# Patient Record
Sex: Male | Born: 1960 | Race: White | Hispanic: No | Smoking: Former smoker
Health system: Southern US, Community
[De-identification: ages and names within clinical notes are randomized; demographics above are authoritative.]

## PROBLEM LIST (undated history)

## (undated) DIAGNOSIS — F419 Anxiety disorder, unspecified: Secondary | ICD-10-CM

## (undated) DIAGNOSIS — I10 Essential (primary) hypertension: Secondary | ICD-10-CM

## (undated) DIAGNOSIS — E349 Endocrine disorder, unspecified: Secondary | ICD-10-CM

## (undated) DIAGNOSIS — E785 Hyperlipidemia, unspecified: Secondary | ICD-10-CM

## (undated) DIAGNOSIS — E039 Hypothyroidism, unspecified: Secondary | ICD-10-CM

## (undated) DIAGNOSIS — T7840XA Allergy, unspecified, initial encounter: Secondary | ICD-10-CM

## (undated) HISTORY — DX: Hyperlipidemia, unspecified: E78.5

## (undated) HISTORY — DX: Anxiety disorder, unspecified: F41.9

## (undated) HISTORY — DX: Endocrine disorder, unspecified: E34.9

## (undated) HISTORY — DX: Allergy, unspecified, initial encounter: T78.40XA

## (undated) HISTORY — DX: Hypothyroidism, unspecified: E03.9

## (undated) HISTORY — DX: Essential (primary) hypertension: I10

---

## 1997-11-25 ENCOUNTER — Observation Stay (HOSPITAL_COMMUNITY): Admission: AD | Admit: 1997-11-25 | Discharge: 1997-11-26 | Payer: Self-pay | Admitting: *Deleted

## 1997-12-02 ENCOUNTER — Ambulatory Visit (HOSPITAL_COMMUNITY): Admission: RE | Admit: 1997-12-02 | Discharge: 1997-12-02 | Payer: Self-pay | Admitting: *Deleted

## 1999-09-30 ENCOUNTER — Ambulatory Visit (HOSPITAL_COMMUNITY): Admission: RE | Admit: 1999-09-30 | Discharge: 1999-09-30 | Payer: Self-pay | Admitting: Internal Medicine

## 1999-09-30 ENCOUNTER — Encounter: Payer: Self-pay | Admitting: Internal Medicine

## 2003-06-26 ENCOUNTER — Ambulatory Visit (HOSPITAL_COMMUNITY): Admission: RE | Admit: 2003-06-26 | Discharge: 2003-06-26 | Payer: Self-pay | Admitting: Internal Medicine

## 2003-06-26 IMAGING — CR DG CHEST 2V
2 series · 2 of 2 positions shown · non-contrast
Comparison: none

CLINICAL DATA: Hypertension.
 TWO VIEW CHEST:
 Comparison [DATE].
 Two view exam of the chest shows no focal consolidation, edema or effusion.  Cardio/pericardial silhouette is within normal limits for size.  The bones are unremarkable.  
 IMPRESSION
 Stable exam without acute cardiopulmonary process.  Left nipple jewelry has been removed in the interval.

[view not recorded (1 of 2)]
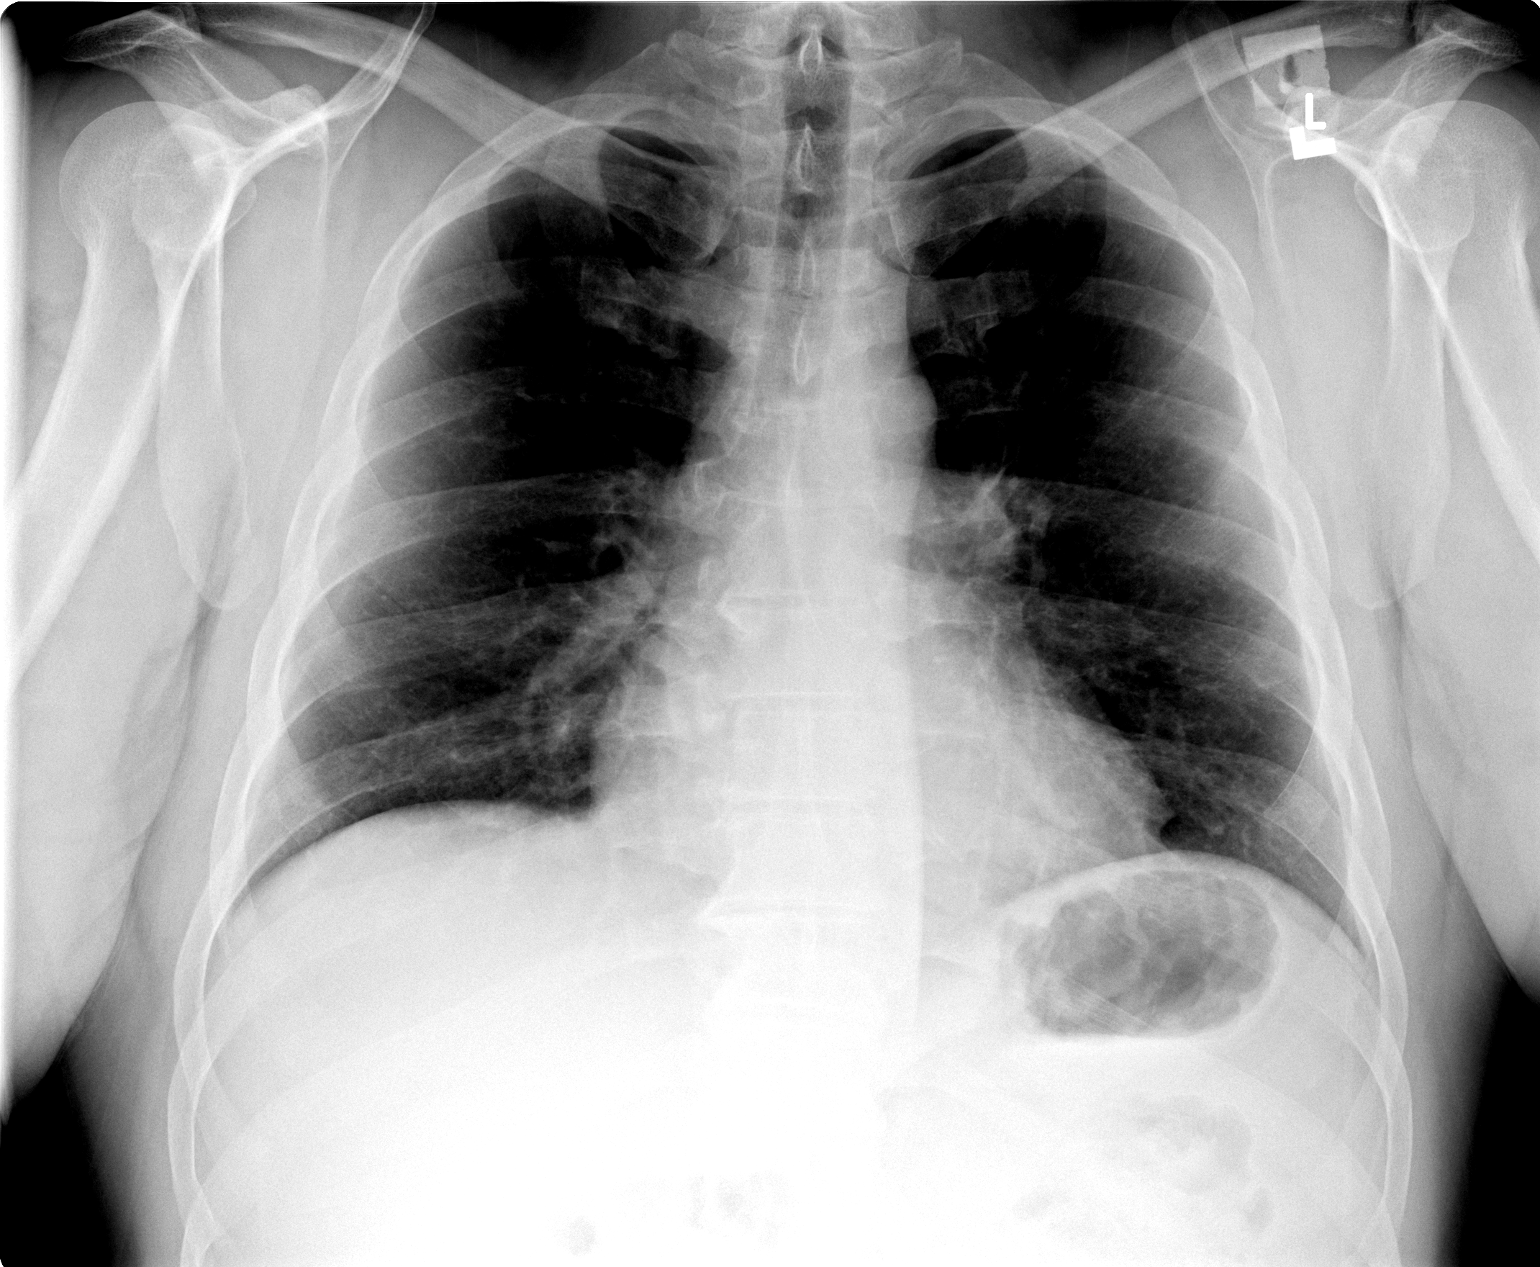

[view not recorded (2 of 2)]
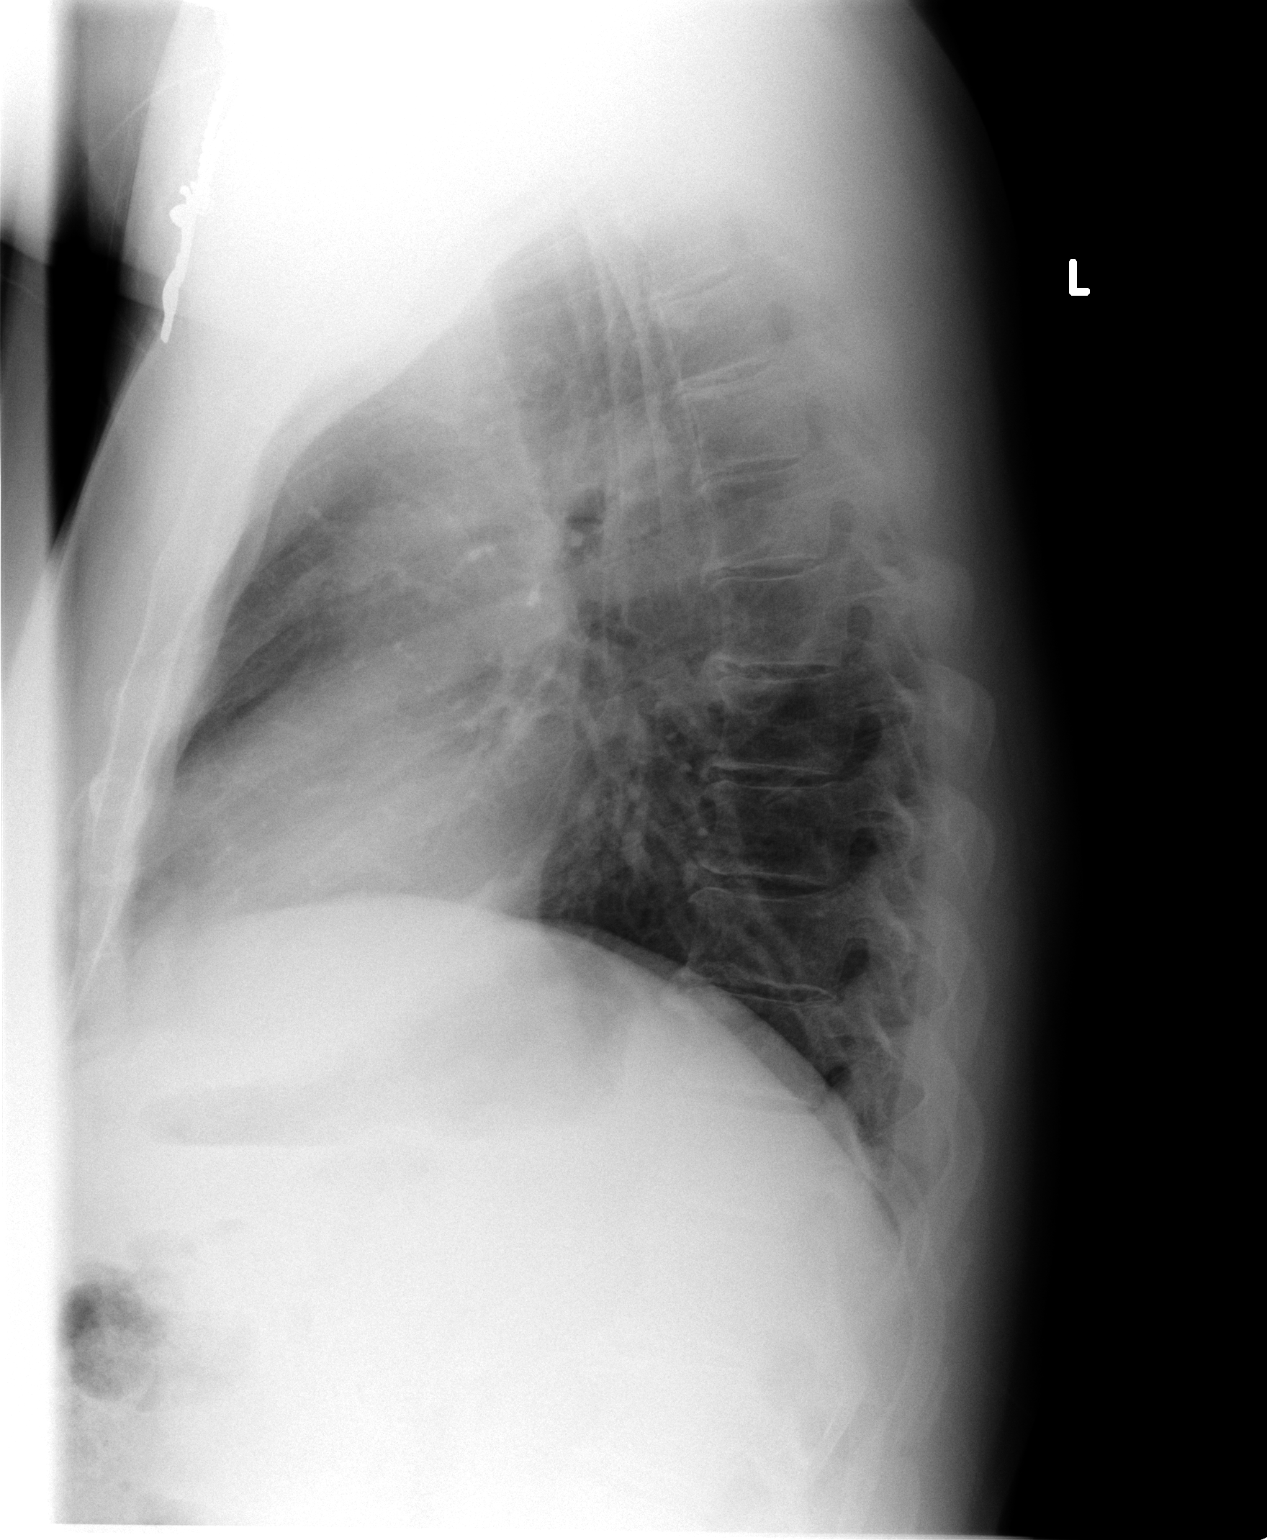

[2 of 2 positions shown; findings below may reference images not displayed]

## 2004-01-31 HISTORY — PX: COLON SURGERY: SHX602

## 2004-01-31 HISTORY — PX: APPENDECTOMY: SHX54

## 2005-08-16 ENCOUNTER — Ambulatory Visit (HOSPITAL_COMMUNITY): Admission: RE | Admit: 2005-08-16 | Discharge: 2005-08-16 | Payer: Self-pay | Admitting: Internal Medicine

## 2005-08-16 IMAGING — CR DG FOOT COMPLETE 3+V*R*
3 series · 3 of 3 positions shown · non-contrast
Comparison: None.

RIGHT FOOT - 3  VIEW:

CLINICAL DATA: Injury. Pain and swelling.

[view not recorded (1 of 3)]
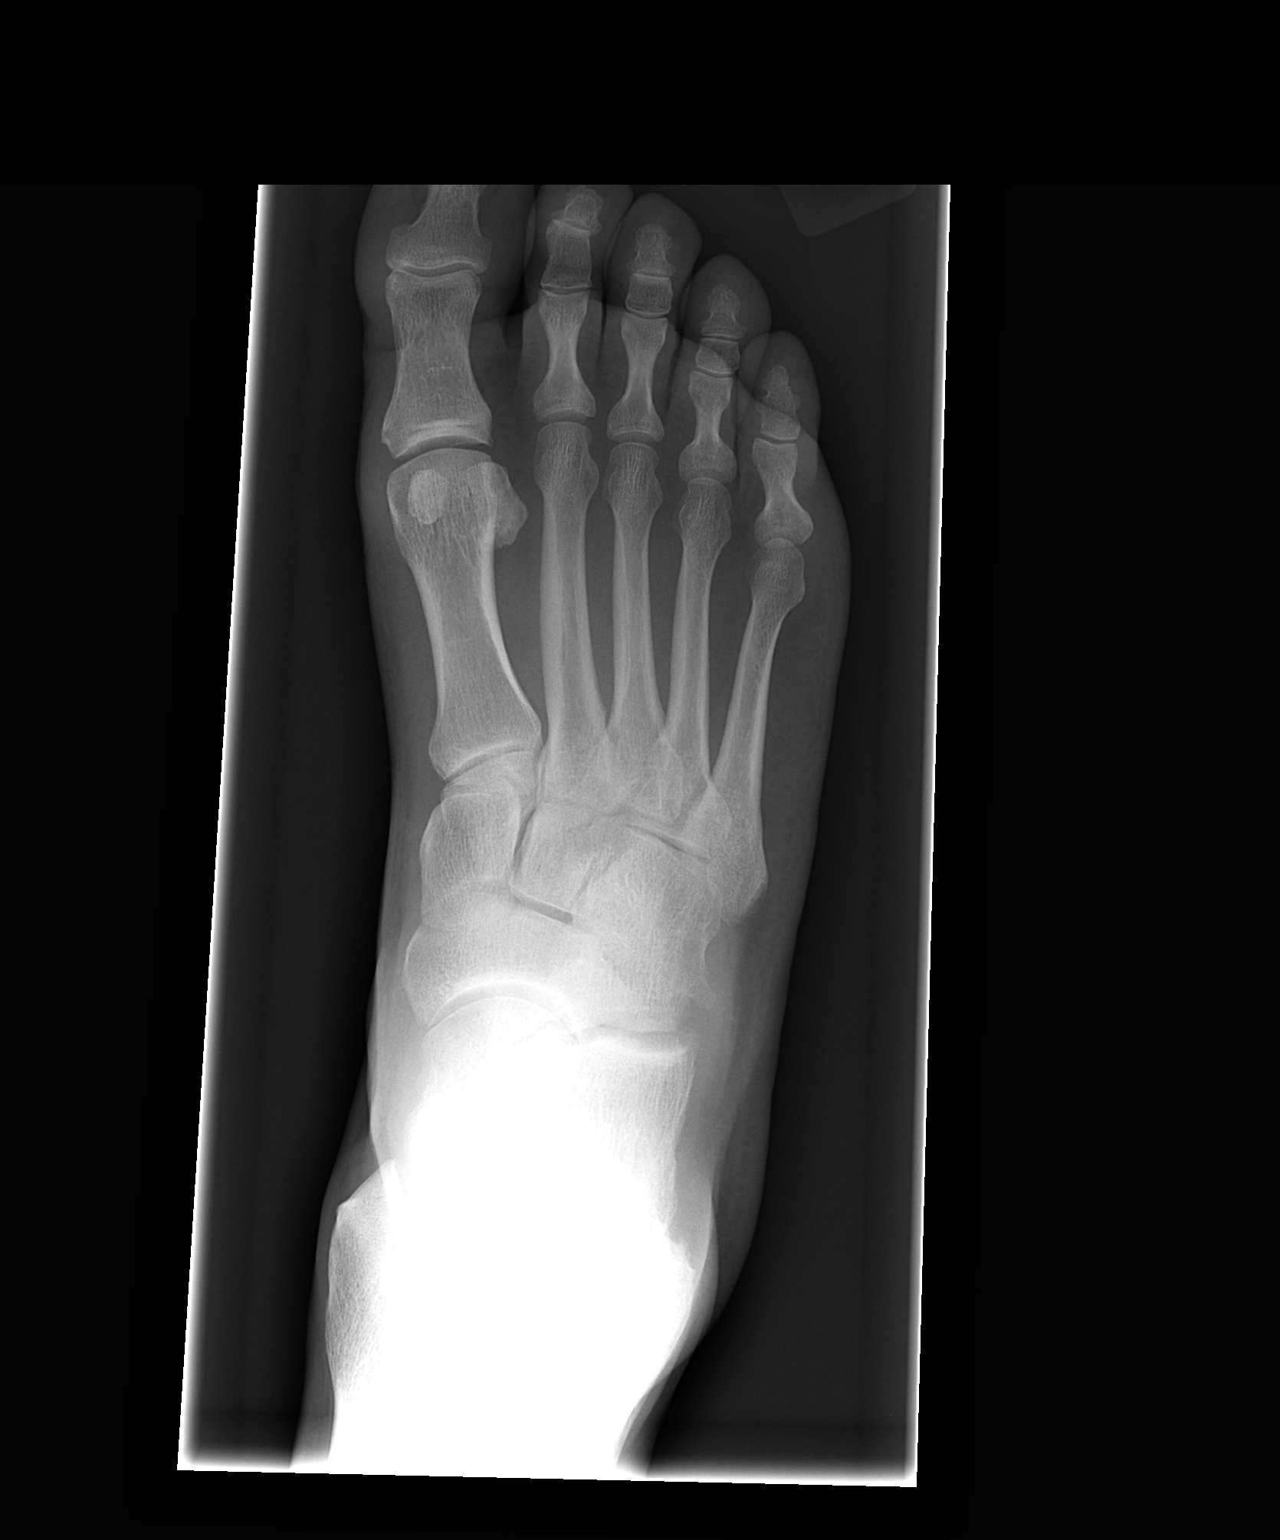

[view not recorded (2 of 3)]
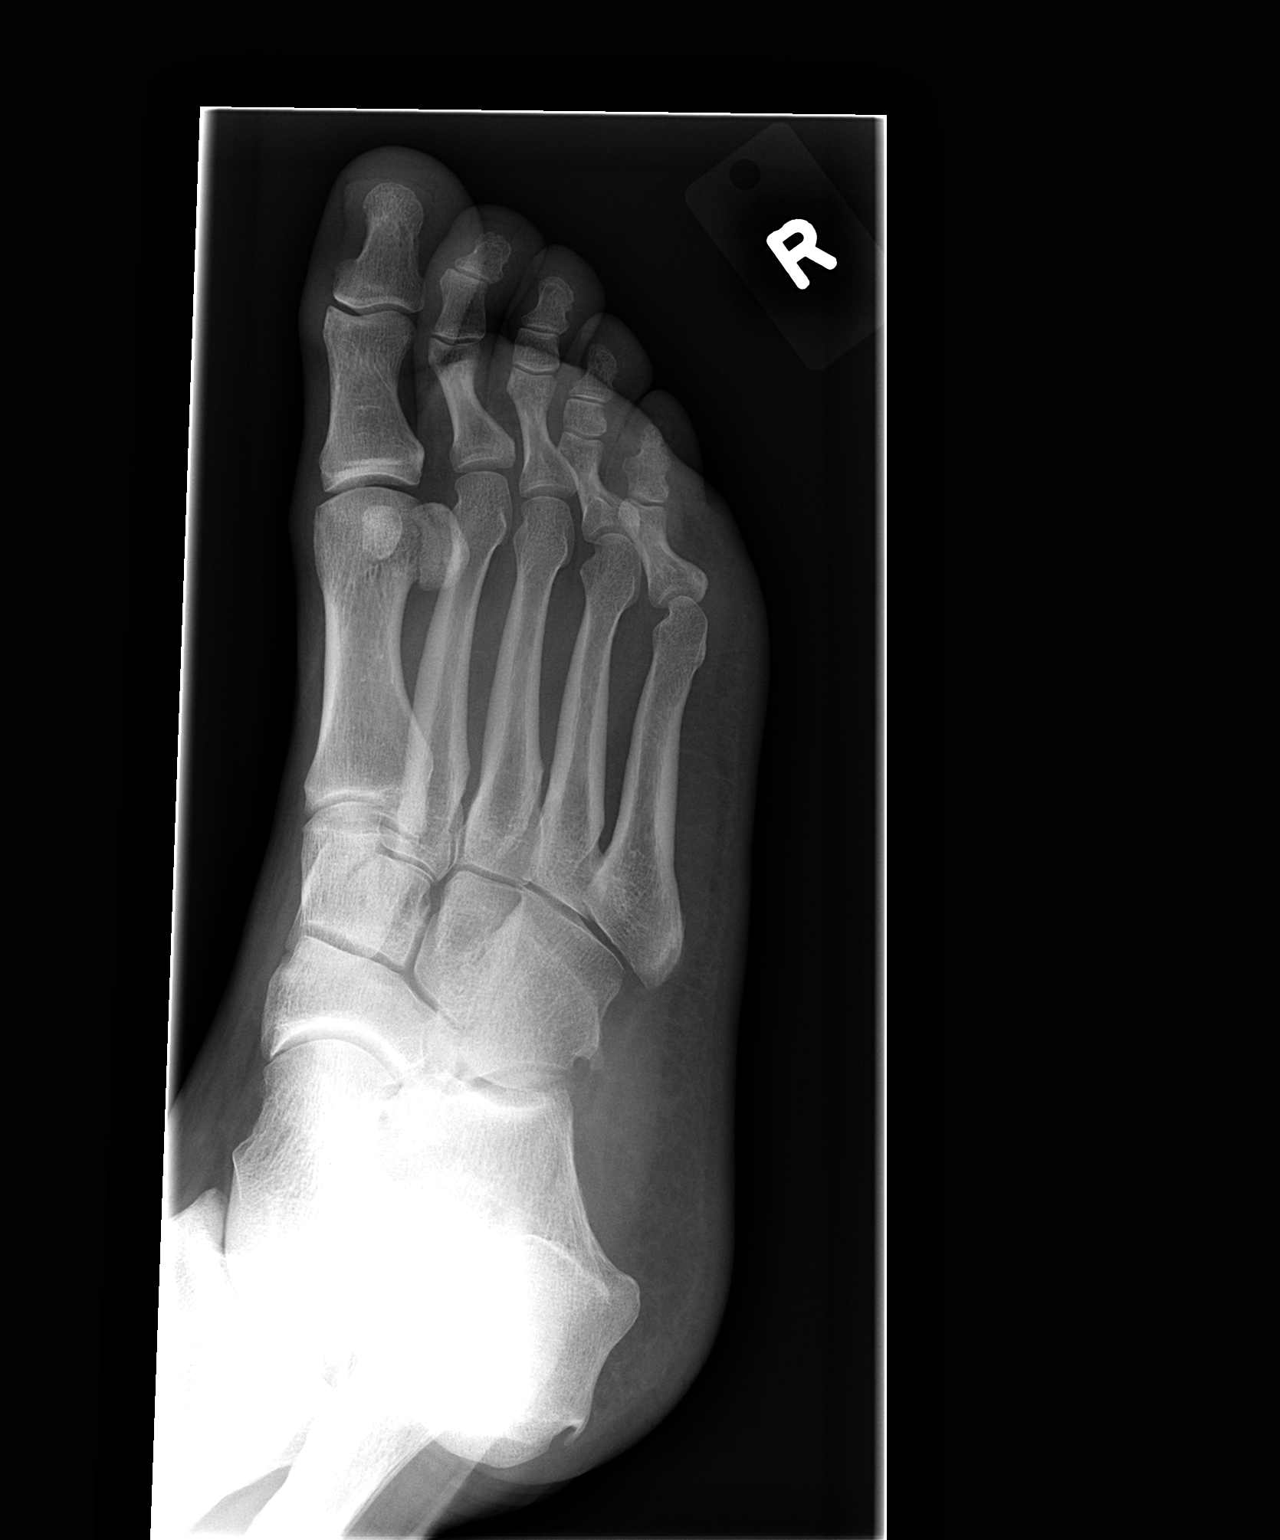

[view not recorded (3 of 3)]
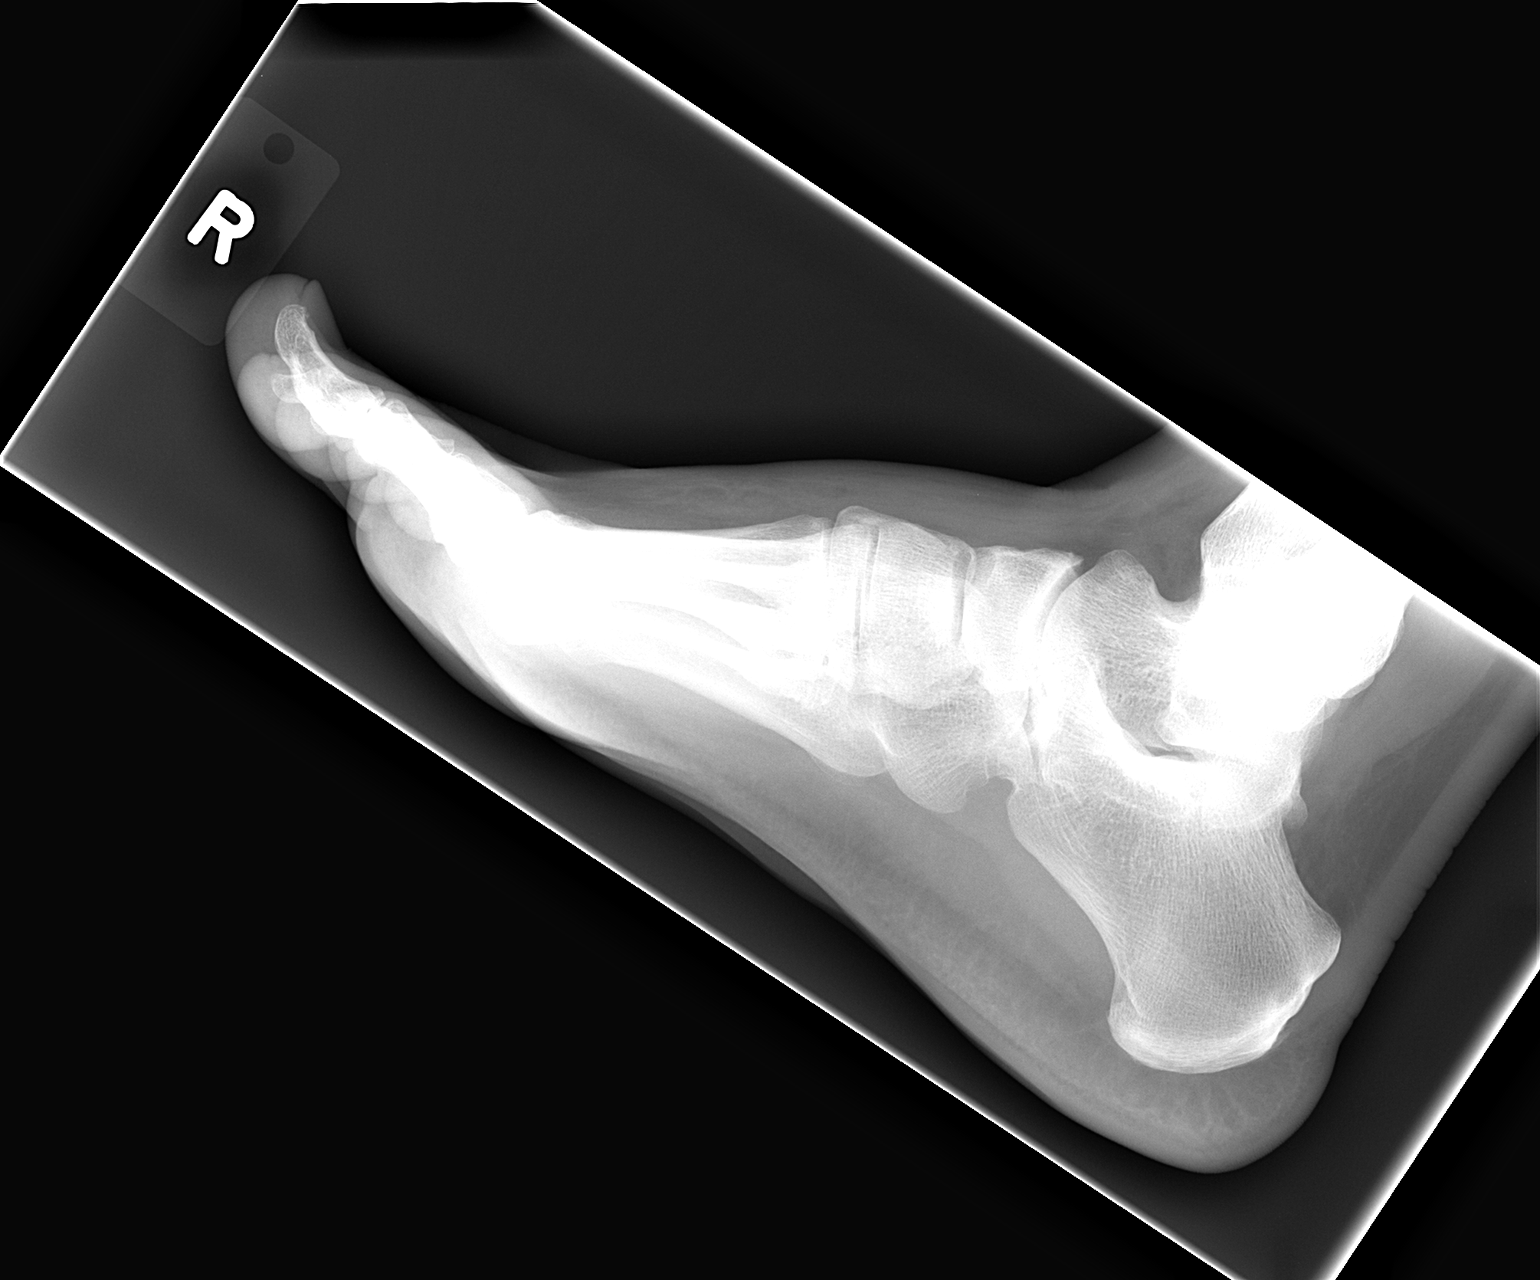

[3 of 3 positions shown; findings below may reference images not displayed]

FINDINGS: No evidence for acute fracture or dislocation.  Bony alignment is
intact.  Overlying soft tissues are unremarkable.
IMPRESSION: No acute bony abnormality.

## 2010-09-09 ENCOUNTER — Ambulatory Visit (AMBULATORY_SURGERY_CENTER): Payer: BC Managed Care – PPO | Admitting: *Deleted

## 2010-09-09 VITALS — Ht 68.0 in | Wt 198.2 lb

## 2010-09-09 DIAGNOSIS — Z1211 Encounter for screening for malignant neoplasm of colon: Secondary | ICD-10-CM

## 2010-09-09 MED ORDER — PEG-KCL-NACL-NASULF-NA ASC-C 100 G PO SOLR: ORAL | Status: DC

## 2010-09-23 ENCOUNTER — Encounter: Payer: Self-pay | Admitting: Internal Medicine

## 2010-09-23 ENCOUNTER — Ambulatory Visit (AMBULATORY_SURGERY_CENTER): Payer: BC Managed Care – PPO | Admitting: Internal Medicine

## 2010-09-23 VITALS — BP 132/87 | HR 79 | Temp 97.7°F | Resp 16

## 2010-09-23 DIAGNOSIS — E349 Endocrine disorder, unspecified: Secondary | ICD-10-CM | POA: Insufficient documentation

## 2010-09-23 DIAGNOSIS — D128 Benign neoplasm of rectum: Secondary | ICD-10-CM

## 2010-09-23 DIAGNOSIS — F419 Anxiety disorder, unspecified: Secondary | ICD-10-CM | POA: Insufficient documentation

## 2010-09-23 DIAGNOSIS — Z1211 Encounter for screening for malignant neoplasm of colon: Secondary | ICD-10-CM

## 2010-09-23 DIAGNOSIS — D129 Benign neoplasm of anus and anal canal: Secondary | ICD-10-CM

## 2010-09-23 DIAGNOSIS — K5730 Diverticulosis of large intestine without perforation or abscess without bleeding: Secondary | ICD-10-CM

## 2010-09-23 DIAGNOSIS — E119 Type 2 diabetes mellitus without complications: Secondary | ICD-10-CM

## 2010-09-23 HISTORY — PX: COLONOSCOPY: SHX174

## 2010-09-23 LAB — GLUCOSE, CAPILLARY: Glucose-Capillary: 102 mg/dL — ABNORMAL HIGH (ref 70–99)

## 2010-09-23 MED ORDER — SODIUM CHLORIDE 0.9 % IV SOLN: 500.0000 mL | INTRAVENOUS | Status: DC

## 2010-09-23 NOTE — Patient Instructions (Addendum)
A very small polyp that appears to be benign  was removed from the rectum. You also have diverticulosis, a common condition that usually does not bother people. We will send you a letter about your results and that means regarding your next routine colonoscopy timing. Please read the patient information about polyps and diverticulosis. Iva Boop, MD, [facility]  Please refer to your blue and neon green sheets for instructions regarding diet and activity for the rest of today.  You may resume your medications as you wold normally take them.

## 2010-09-26 ENCOUNTER — Telehealth: Payer: Self-pay | Admitting: *Deleted

## 2010-09-26 NOTE — Telephone Encounter (Signed)
No answer or identifier on answering machine at home number. No message left for patient.

## 2010-09-28 ENCOUNTER — Encounter: Payer: Self-pay | Admitting: Internal Medicine

## 2010-09-28 NOTE — Progress Notes (Signed)
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

## 2012-02-17 ENCOUNTER — Emergency Department (INDEPENDENT_AMBULATORY_CARE_PROVIDER_SITE_OTHER)
Admission: EM | Admit: 2012-02-17 | Discharge: 2012-02-17 | Disposition: A | Discharge status: Home / Self Care | Payer: BC Managed Care – PPO | Source: Home / Self Care | Attending: Family Medicine | Admitting: Family Medicine

## 2012-02-17 ENCOUNTER — Emergency Department (INDEPENDENT_AMBULATORY_CARE_PROVIDER_SITE_OTHER): Payer: BC Managed Care – PPO

## 2012-02-17 ENCOUNTER — Encounter: Payer: Self-pay | Admitting: Emergency Medicine

## 2012-02-17 DIAGNOSIS — W19XXXA Unspecified fall, initial encounter: Secondary | ICD-10-CM

## 2012-02-17 DIAGNOSIS — S6990XA Unspecified injury of unspecified wrist, hand and finger(s), initial encounter: Secondary | ICD-10-CM

## 2012-02-17 DIAGNOSIS — S5001XA Contusion of right elbow, initial encounter: Secondary | ICD-10-CM

## 2012-02-17 DIAGNOSIS — S60211A Contusion of right wrist, initial encounter: Secondary | ICD-10-CM

## 2012-02-17 DIAGNOSIS — S60219A Contusion of unspecified wrist, initial encounter: Secondary | ICD-10-CM

## 2012-02-17 DIAGNOSIS — S5000XA Contusion of unspecified elbow, initial encounter: Secondary | ICD-10-CM

## 2012-02-17 DIAGNOSIS — M25529 Pain in unspecified elbow: Secondary | ICD-10-CM

## 2012-02-17 DIAGNOSIS — M25539 Pain in unspecified wrist: Secondary | ICD-10-CM

## 2012-02-17 IMAGING — CR DG ELBOW COMPLETE 3+V*R*
4 series · 4 of 4 positions shown · non-contrast
Comparison: None.

CLINICAL DATA: Fall 1 week ago

RIGHT ELBOW - COMPLETE 3+ VIEW

[view not recorded (1 of 4)]
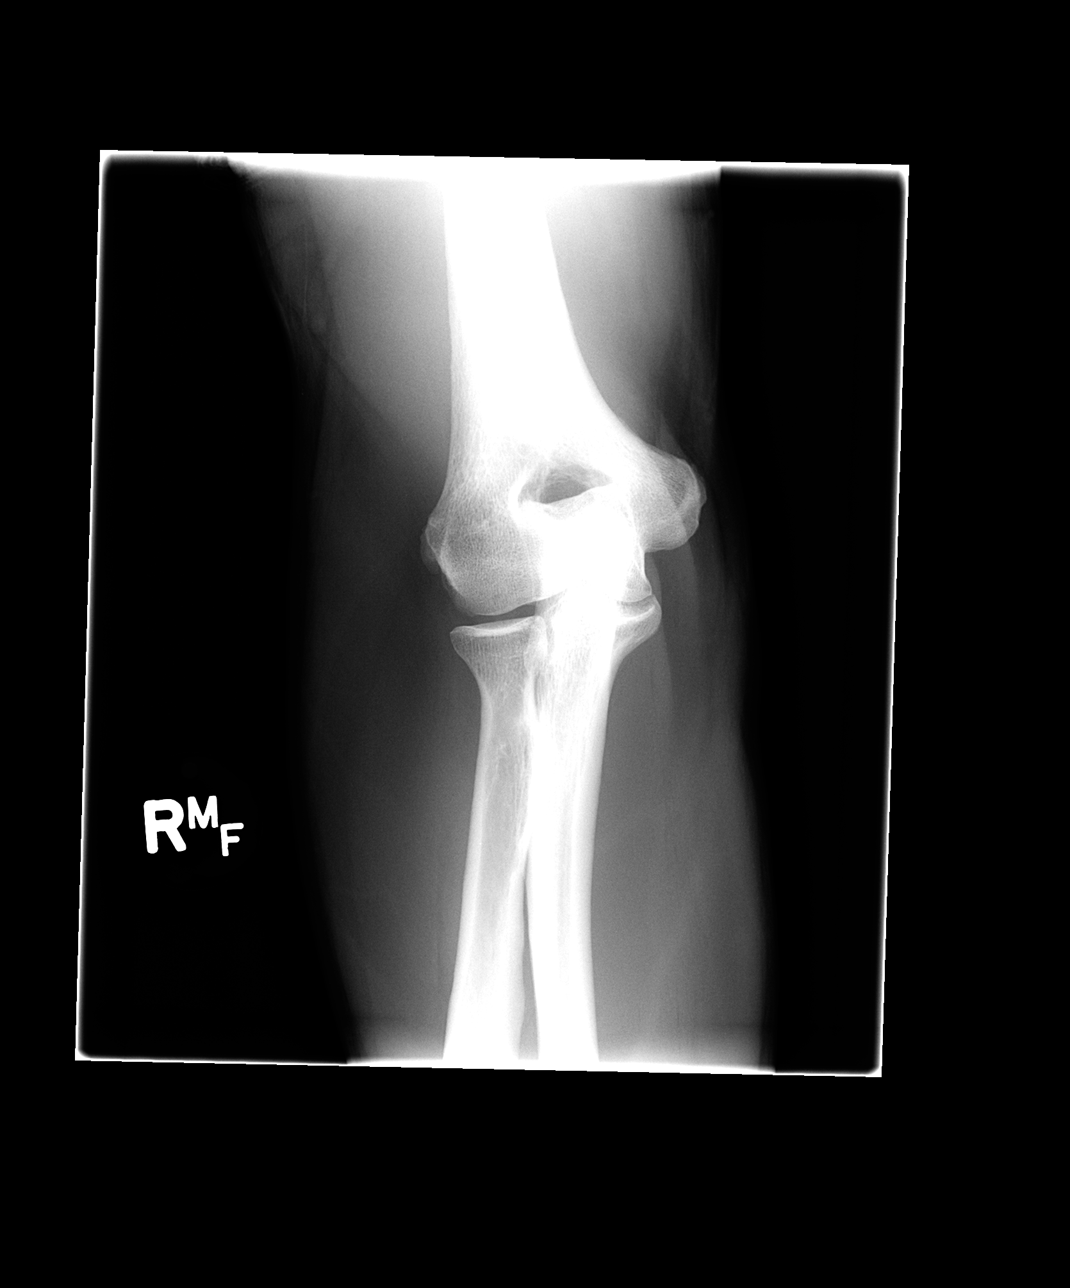

[view not recorded (2 of 4)]
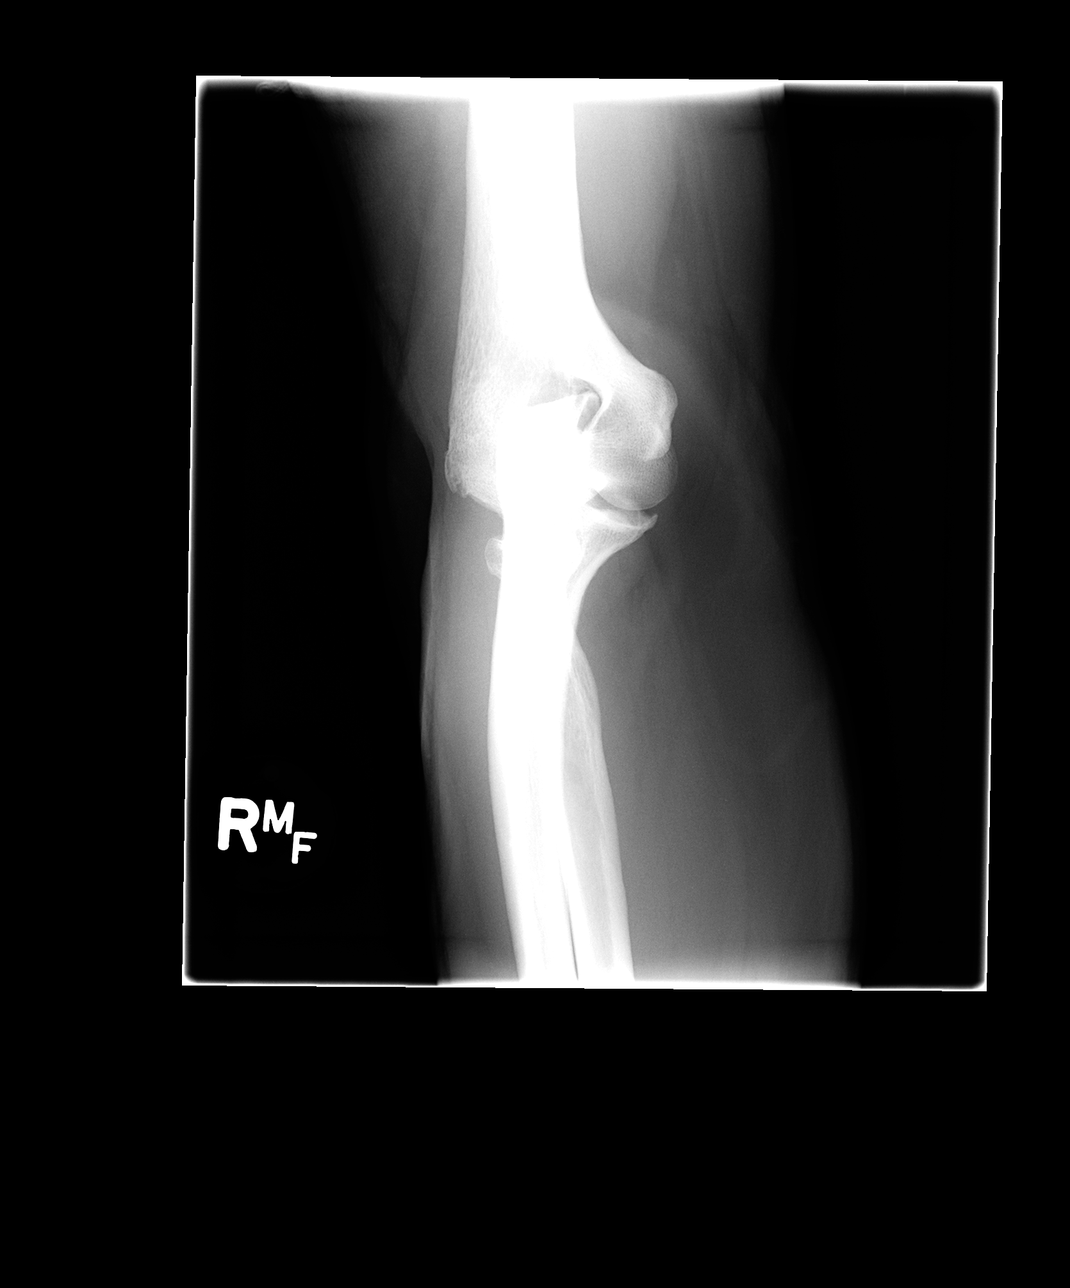

[view not recorded (3 of 4)]
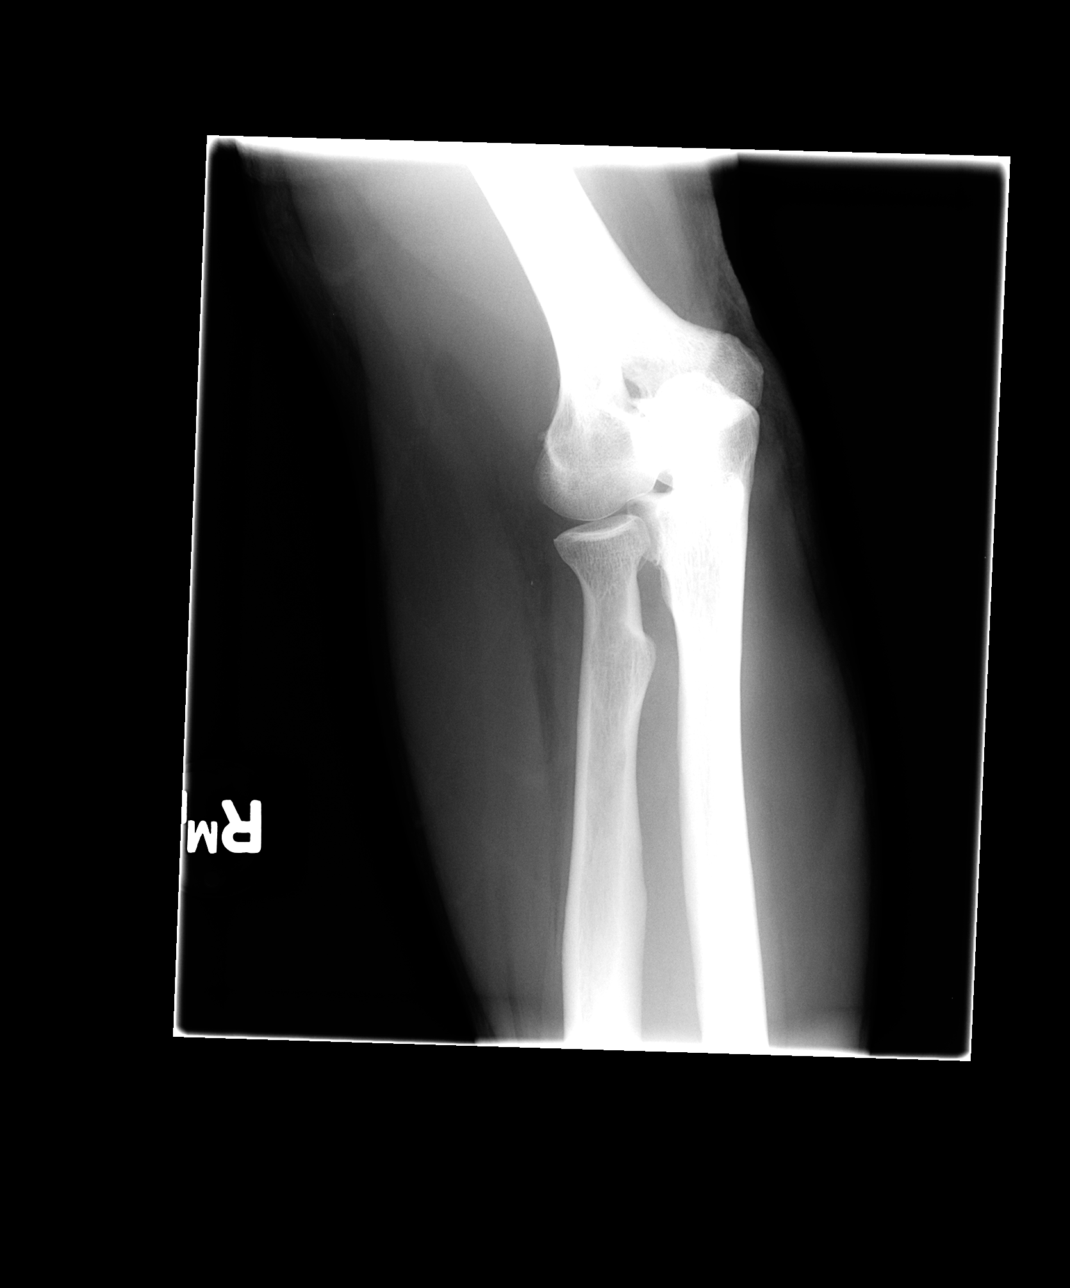

[view not recorded (4 of 4)]
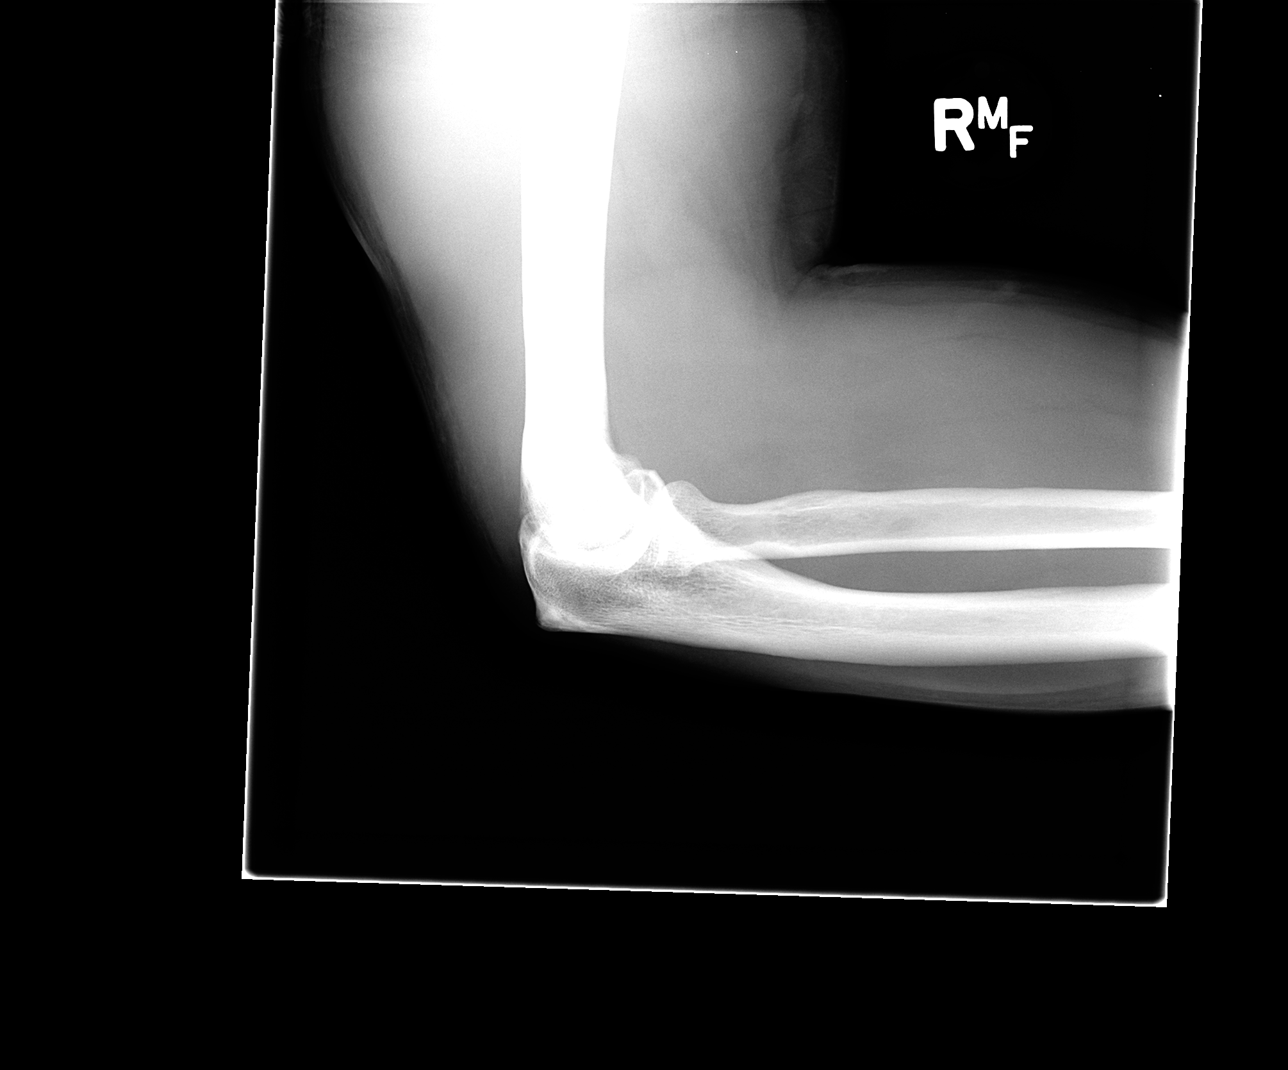

[4 of 4 positions shown; findings below may reference images not displayed]

FINDINGS: Negative for fracture.  Mild degenerative change with
spurring of the coronoid process.  Mild spurring of the medial
epicondyle.
IMPRESSION: Negative for fracture.

## 2012-02-17 IMAGING — CR DG WRIST COMPLETE 3+V*R*
2 series · 2 of 2 positions shown · non-contrast
Comparison: None

CLINICAL DATA: Fall 1 week ago.

RIGHT WRIST - COMPLETE 3+ VIEW

[view not recorded (1 of 2)]
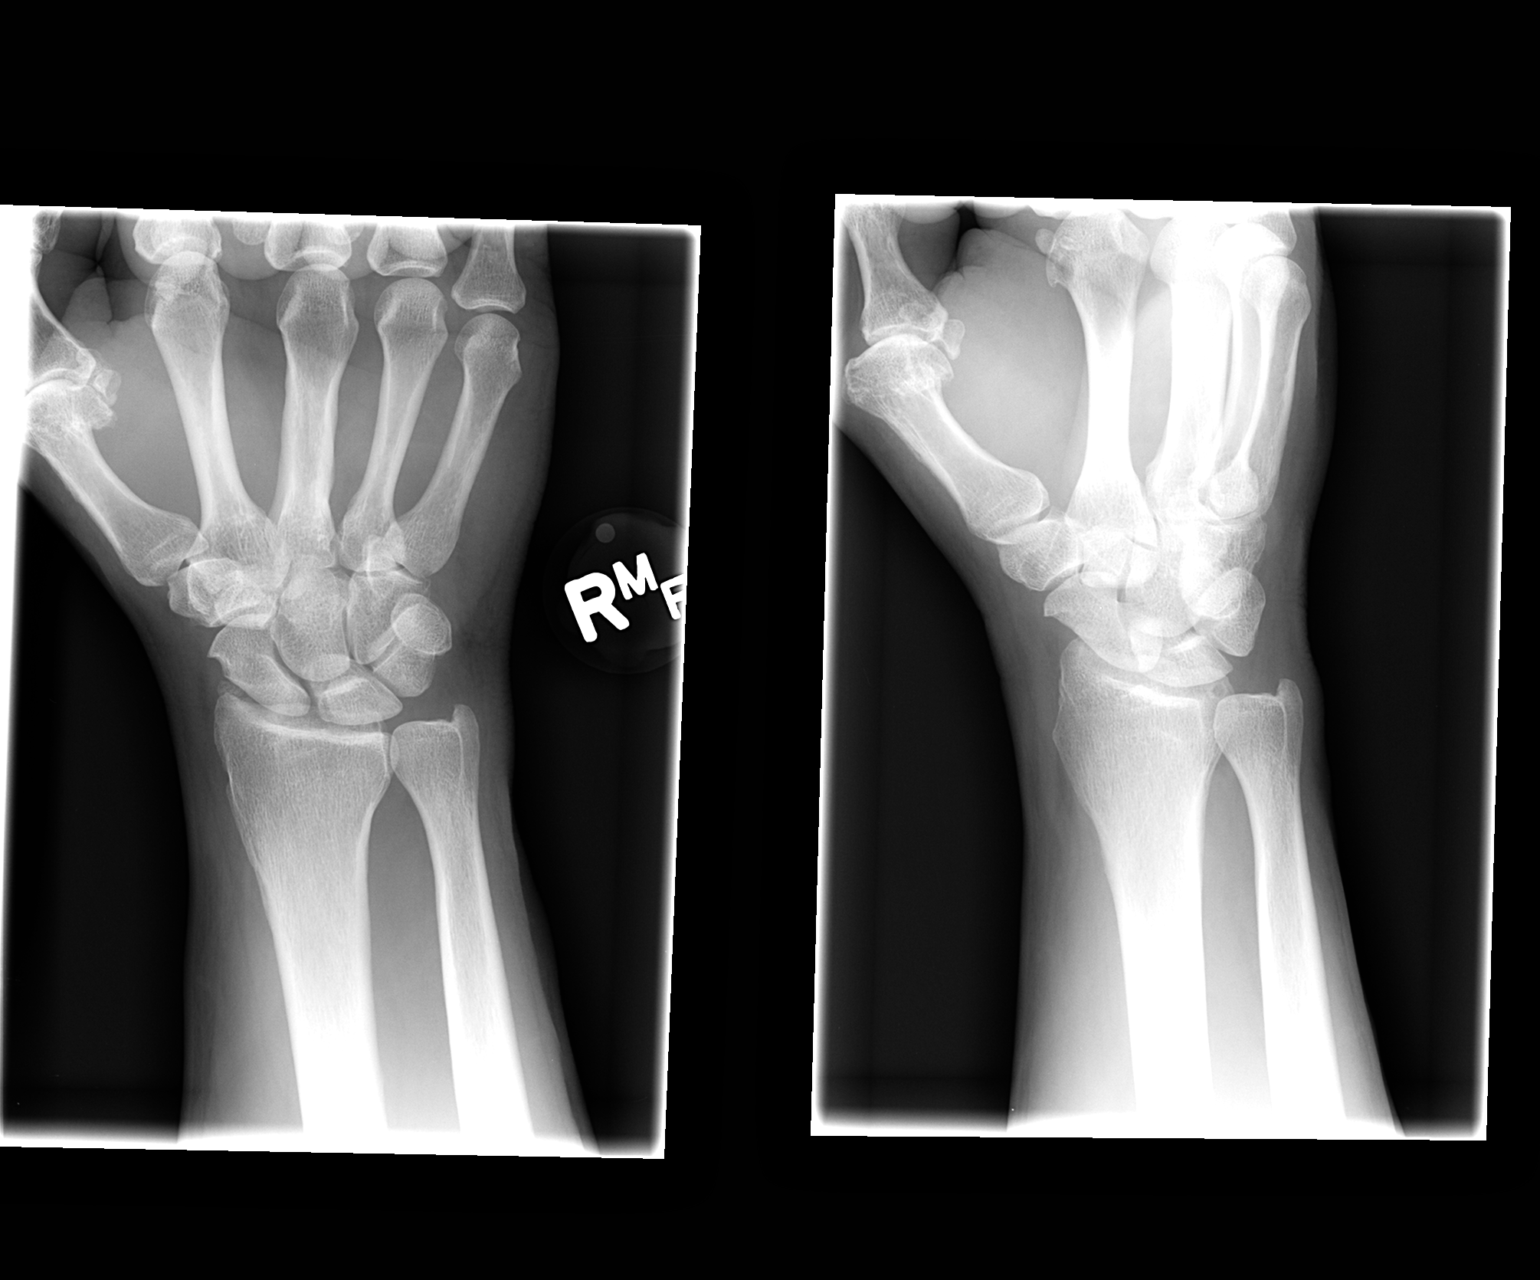

[view not recorded (2 of 2)]
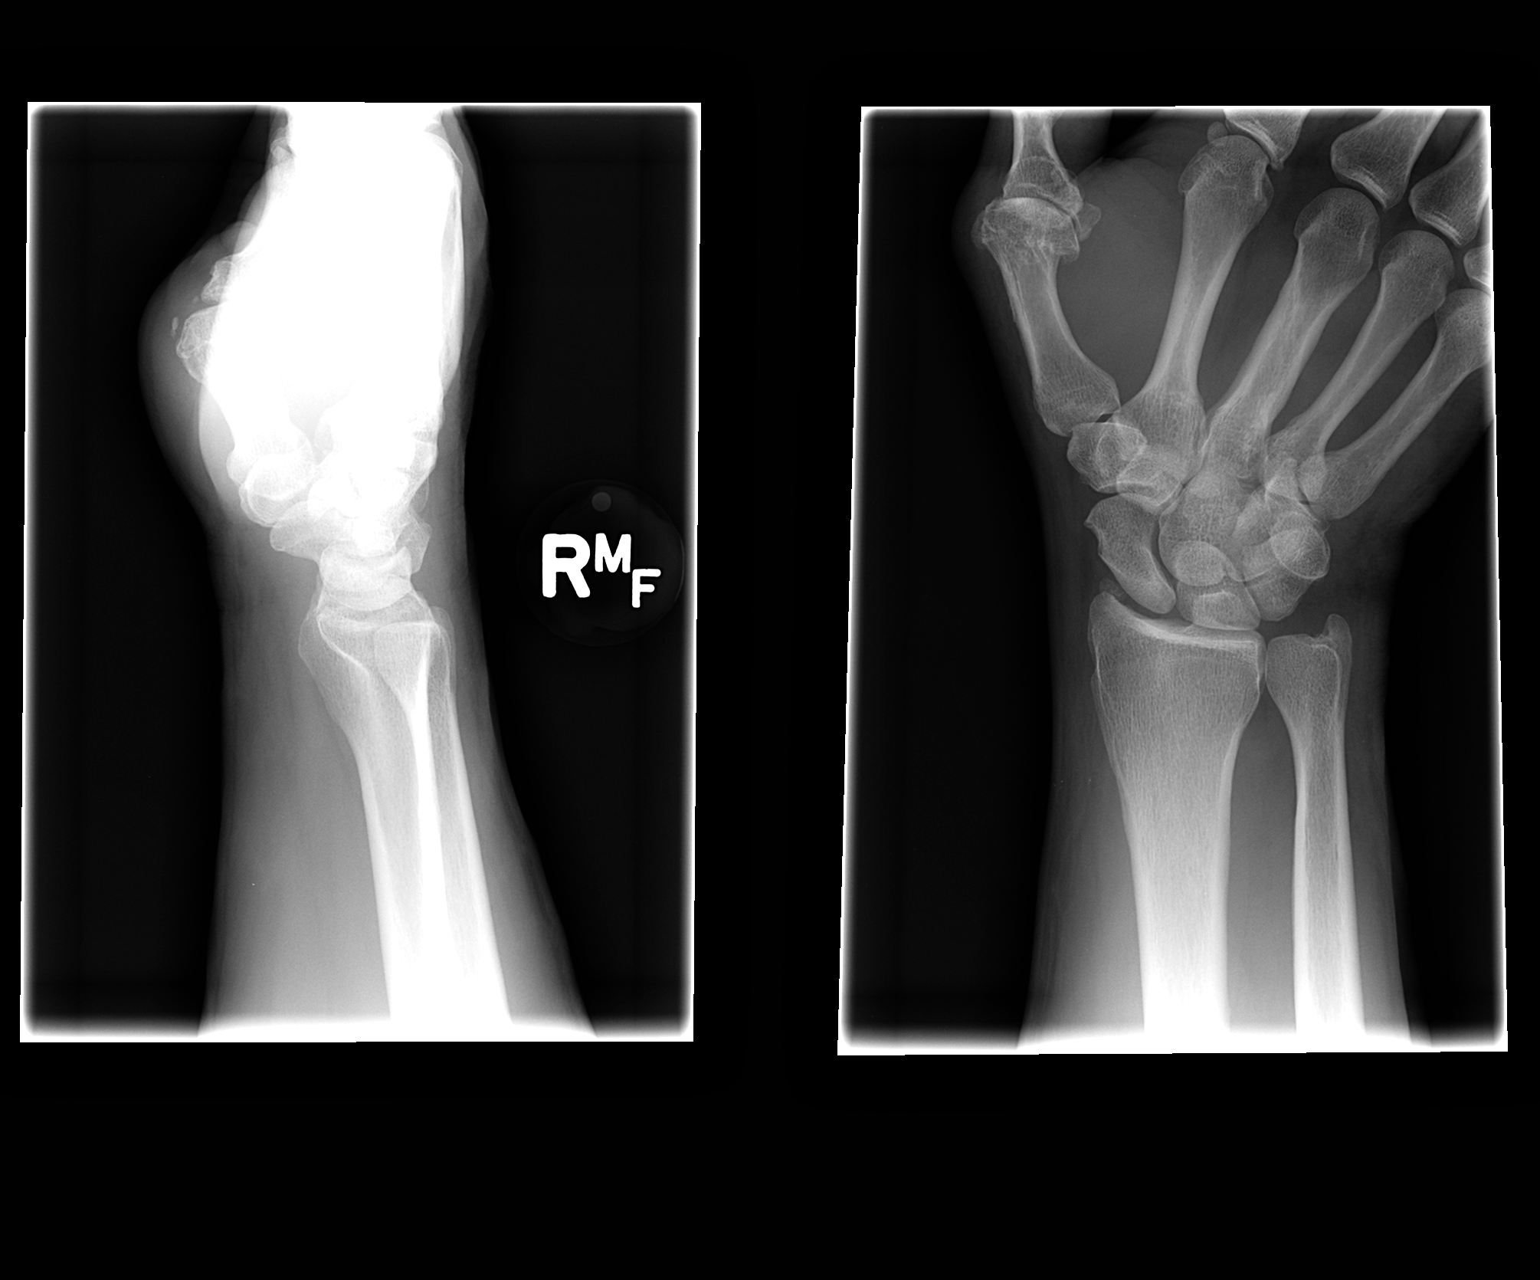

[2 of 2 positions shown; findings below may reference images not displayed]

FINDINGS: Negative for acute fracture.  Well corticated ossicle
adjacent to the   radial styloid appears chronic.  Radiocarpal
joint is intact.
IMPRESSION: No acute fracture.

## 2012-02-17 NOTE — ED Notes (Signed)
Reports falling on right side 7 days ago with assorted aches following; now persistent and intense pain in right elbow, and milder pain in right wrist.

## 2012-02-17 NOTE — ED Provider Notes (Signed)
History     CSN: [id number]  Arrival date & time 02/17/12  1449   First MD Initiated Contact with Patient 02/17/12 1516      Chief Complaint  Patient presents with  . Elbow Pain      HPI Comments: Patient fell on icy deck one week ago, injuring his right elbow and wrist.  He has persistent tenderness and pain in his right elbow and right wrist.  Patient is a 52 y.o. male presenting with arm injury. The history is provided by the patient.  Arm Injury  Episode onset: 1 week ago. The incident occurred at home. The injury mechanism was a fall. There is an injury to the right elbow and right wrist. The pain is moderate. It is unlikely that a foreign body is present. Pertinent negatives include no numbness, no focal weakness and no weakness. He is right-handed.    Past Medical History  Diagnosis Date  . Allergy   . Diabetes mellitus   . Anxiety   . Testosterone deficiency     Past Surgical History  Procedure Date  . Colon surgery 2006    large diverticulum resected  . Appendectomy 2006  . Colonoscopy 09/23/2010    diverticulosis, lymphoid rectal polyp    History reviewed. No pertinent family history.  History  Substance Use Topics  . Smoking status: Former Smoker    Quit date: 01/31/1995  . Smokeless tobacco: Never Used  . Alcohol Use: Yes     Comment: OCC WINE OR BEER,MAYBE MONTHLY      Review of Systems  Neurological: Negative for focal weakness, weakness and numbness.  All other systems reviewed and are negative.    Allergies  Food  Home Medications   Current Outpatient Rx  Name  Route  Sig  Dispense  Refill  . B COMPLEX PO TABS   Oral   Take 1 tablet by mouth daily.           Marland Kitchen VITAMIN D 2000 UNITS PO TABS   Oral   Take 2,000 Units by mouth daily.           Marland Kitchen CLONAZEPAM 2 MG PO TABS   Oral   Take 1 tablet by mouth Three times a day. 1/2-1 tab         . VITAMIN B 12 PO   Oral   Take 1,000 mcg by mouth daily.           . FENOFIBRATE  MICRONIZED 134 MG PO CAPS   Oral   Take 1 tablet by mouth Daily.         . GLYBURIDE 5 MG PO TABS   Oral   Take 0.5 tablets by mouth Twice daily.         Marland Kitchen METFORMIN HCL ER 500 MG PO TB24   Oral   Take 2 tablets by mouth Twice daily.         Marland Kitchen ONE-DAILY MULTI VITAMINS PO TABS   Oral   Take 1 tablet by mouth daily.           Marland Kitchen FISH OIL PO   Oral   Take 1,400 mg by mouth daily.           . TESTOSTERONE CYPIONATE 200 MG/ML IM OIL               . TRAZODONE HCL 300 MG PO TABS   Oral   Take 2 tablets by mouth At bedtime.  BP 144/76  Pulse 78  Temp 97.9 F (36.6 C) (Oral)  Resp 16  Ht 5\' 8"  (1.727 m)  Wt 185 lb (83.915 kg)  BMI 28.13 kg/m2  SpO2 97%  Physical Exam  Nursing note and vitals reviewed. Constitutional: He is oriented to person, place, and time. He appears well-developed and well-nourished. No distress.  HENT:  Head: Normocephalic and atraumatic.  Eyes: Conjunctivae normal are normal. Pupils are equal, round, and reactive to light.  Neck: Normal range of motion. No thyromegaly present.  Musculoskeletal: Normal range of motion. He exhibits tenderness. He exhibits no edema.       Right elbow: He exhibits normal range of motion, no swelling, no effusion, no deformity and no laceration. tenderness found. Medial epicondyle tenderness noted. No radial head, no lateral epicondyle and no olecranon process tenderness noted.       Right wrist: He exhibits tenderness and bony tenderness. He exhibits normal range of motion, no swelling, no effusion, no crepitus, no deformity and no laceration.       Right wrist reveals mild point tenderness over the ulnar styloid.  Good range of motion.  Wrist strength is intact in all planes.  Distal Neurovascular function is intact.            Neurological: He is alert and oriented to person, place, and time.  Skin: Skin is warm and dry. No rash noted.    ED Course  Procedures  none   Dg Elbow Complete  Right  02/17/2012  *RADIOLOGY REPORT*  Clinical Data: Fall 1 week ago  RIGHT ELBOW - COMPLETE 3+ VIEW  Comparison: None.  Findings: Negative for fracture.  Mild degenerative change with spurring of the coronoid process.  Mild spurring of the medial epicondyle.  IMPRESSION: Negative for fracture.   Original Report Authenticated By: Janeece Riggers, M.D.    Dg Wrist Complete Right  02/17/2012  *RADIOLOGY REPORT*  Clinical Data: Fall 1 week ago.  RIGHT WRIST - COMPLETE 3+ VIEW  Comparison: None  Findings: Negative for acute fracture.  Well corticated ossicle adjacent to the   radial styloid appears chronic.  Radiocarpal joint is intact.  IMPRESSION: No acute fracture.   Original Report Authenticated By: Janeece Riggers, M.D.      1. Contusion of right elbow   2. Contusion of right wrist; no evidence of TFCC injury on exam      MDM   Take Ibuprofen 200mg , 4 tabs every 8 hours with food.  Apply ice pack two or three times daily.  Avoid golf until resolved. Followup with orthopedist if not improving one week.  Lattie Haw, MD 02/17/12 [phone]

## 2012-12-15 ENCOUNTER — Other Ambulatory Visit: Payer: Self-pay | Admitting: Internal Medicine

## 2013-02-17 ENCOUNTER — Ambulatory Visit: Payer: Self-pay | Admitting: Physician Assistant

## 2013-03-20 ENCOUNTER — Encounter: Payer: Self-pay | Admitting: Internal Medicine

## 2013-04-13 ENCOUNTER — Other Ambulatory Visit: Payer: Self-pay | Admitting: Internal Medicine

## 2013-04-26 ENCOUNTER — Other Ambulatory Visit: Payer: Self-pay | Admitting: Internal Medicine

## 2013-05-07 ENCOUNTER — Other Ambulatory Visit: Payer: Self-pay | Admitting: Emergency Medicine

## 2013-05-07 ENCOUNTER — Telehealth: Payer: Self-pay | Admitting: *Deleted

## 2013-05-07 MED ORDER — CLONAZEPAM 2 MG PO TABS: 2.0000 mg | ORAL_TABLET | Freq: Three times a day (TID) | ORAL | Status: DC | PRN

## 2013-05-07 NOTE — Telephone Encounter (Signed)
CLONAZEPAM TAKE QID = NEEDS TO [facility]: [place]  PT IS OUT CAN WE PLEASE CALL IN

## 2013-05-17 DIAGNOSIS — I10 Essential (primary) hypertension: Secondary | ICD-10-CM | POA: Insufficient documentation

## 2013-05-17 DIAGNOSIS — E785 Hyperlipidemia, unspecified: Secondary | ICD-10-CM | POA: Insufficient documentation

## 2013-05-17 DIAGNOSIS — E119 Type 2 diabetes mellitus without complications: Secondary | ICD-10-CM

## 2013-05-17 DIAGNOSIS — E039 Hypothyroidism, unspecified: Secondary | ICD-10-CM | POA: Insufficient documentation

## 2013-05-18 DIAGNOSIS — E559 Vitamin D deficiency, unspecified: Secondary | ICD-10-CM | POA: Insufficient documentation

## 2013-05-18 DIAGNOSIS — Z79899 Other long term (current) drug therapy: Secondary | ICD-10-CM | POA: Insufficient documentation

## 2013-05-18 NOTE — Progress Notes (Signed)
Patient ID: Hunter Fields, male   DOB: [birth year], 53 y.o.   MRN: [id number]    This very nice 53 y.o. MWM presents for 3 month follow up with Hypertension, Hyperlipidemia, Pre-Diabetes, Testosterone and Vitamin D Deficiency. Patient also has essential tremor.   HTN predates since 55. BP has been controlled at home. Today's BP: 160/100 mmHg . Patient denies any cardiac type chest pain, palpitations, dyspnea/orthopnea/PND, dizziness, claudication, or dependent edema.   Hyperlipidemia is not controlled with diet & meds. Last Cholesterol was 178, Triglycerides were 514, HDL 31 and LDL not caklculated. Patient denies myalgias or other med SE's.    Also, the patient has history of T2 NIDDM in  2002 and A1c 6.6% in Oct 2014. Patient denies any  symptoms of reactive hypoglycemia, diabetic polys, paresthesias or visual blurring.   Further, Patient has history of Vitamin D Deficiency of 40 in 2008 min D of 40 in Oct 2014.ments vitamin D without any suspected side-effects.  Current Outpatient Prescriptions on File Prior to Visit  Medication Sig Dispense Refill  . b complex vitamins tablet Take 1 tablet by mouth daily.        . Cholecalciferol (VITAMIN D) 2000 UNITS tablet Take 2,000 Units by mouth daily.        . clonazePAM (KLONOPIN) 2 MG tablet Take 1 tablet (2 mg total) by mouth 3 (three) times daily as needed for anxiety. 1/2-1 tab  90 tablet  0  . Cyanocobalamin (VITAMIN B 12 PO) Take 1,000 mcg by mouth daily.        . fenofibrate micronized (LOFIBRA) 134 MG capsule TAKE 1 CAPSULE DAILY FOR BLOOD FATS  90 capsule  1  . metFORMIN (GLUCOPHAGE-XR) 500 MG 24 hr tablet TAKE 2 TABLETS TWICE A DAY FOR DIABETES MELLITUS  360 tablet  0  . Multiple Vitamin (MULTIVITAMIN) tablet Take 1 tablet by mouth daily.        . Omega-3 Fatty Acids (FISH OIL PO) Take 1,400 mg by mouth daily.        Marland Kitchen testosterone cypionate (DEPOTESTOTERONE CYPIONATE) 200 MG/ML injection inject 2cc instramuscularly every 2 weeks or as  directed by physician  10 mL  1   No current facility-administered medications on file prior to visit.     Allergies  Allergen Reactions  . Food Swelling    MUSHROOMS=SWELLING    PMHx:   Past Medical History  Diagnosis Date  . Allergy   . Diabetes mellitus   . Anxiety   . Testosterone deficiency   . Hyperlipidemia   . Hypertension   . Hypothyroid     FHx:    Reviewed / unchanged  SHx:    Reviewed / unchanged   Systems Review: Constitutional: Denies fever, chills, wt changes, headaches, insomnia, fatigue, night sweats, change in appetite. Eyes: Denies redness, blurred vision, diplopia, discharge, itchy, watery eyes.  ENT: Denies discharge, congestion, post nasal drip, epistaxis, sore throat, earache, hearing loss, dental pain, tinnitus, vertigo, sinus pain, snoring.  CV: Denies chest pain, palpitations, irregular heartbeat, syncope, dyspnea, diaphoresis, orthopnea, PND, claudication, edema. Respiratory: denies cough, dyspnea, DOE, pleurisy, hoarseness, laryngitis, wheezing.  Gastrointestinal: Denies dysphagia, odynophagia, heartburn, reflux, water brash, abdominal pain or cramps, nausea, vomiting, bloating, diarrhea, constipation, hematemesis, melena, hematochezia,  or hemorrhoids. Genitourinary: Denies dysuria, frequency, urgency, nocturia, hesitancy, discharge, hematuria, flank pain. Musculoskeletal: Denies arthralgias, myalgias, stiffness, jt. swelling, pain, limp, strain/sprain.  Skin: Denies pruritus, rash, hives, warts, acne, eczema, change in skin lesion(s). Neuro: No weakness, tremor, incoordination, spasms, paresthesia,  or pain. Psychiatric: Denies confusion, memory loss, or sensory loss. Endo: Denies change in weight, skin, hair change.  Heme/Lymph: No excessive bleeding, bruising, orenlarged lymph nodes.   Exam:  BP 160/100  Pulse 76  Temp(Src) 98.4 F (36.9 C) (Temporal)  Resp 16  Ht 5' 7.5" (1.715 m)  Wt 186 lb 9.6 oz (84.641 kg)  BMI 28.78  kg/m2  Appears well nourished - in no distress. Eyes: PERRLA, EOMs, conjunctiva no swelling or erythema. Sinuses: No frontal/maxillary tenderness ENT/Mouth: EAC's clear, TM's nl w/o erythema, bulging. Nares clear w/o erythema, swelling, exudates. Oropharynx clear without erythema or exudates. Oral hygiene is good. Tongue normal, non obstructing. Hearing intact.  Neck: Supple. Thyroid nl. Car 2+/2+ without bruits, nodes or JVD. Chest: Respirations nl with BS clear & equal w/o rales, rhonchi, wheezing or stridor.  Cor: Heart sounds normal w/ regular rate and rhythm without sig. murmurs, gallops, clicks, or rubs. Peripheral pulses normal and equal  without edema.  Abdomen: Soft & bowel sounds normal. Non-tender w/o guarding, rebound, hernias, masses, or organomegaly.  Lymphatics: Unremarkable.  Musculoskeletal: Full ROM all peripheral extremities, joint stability, 5/5 strength, and normal gait.  Skin: Warm, dry without exposed rashes, lesions, ecchymosis apparent.  Neuro: Cranial nerves intact, reflexes equal bilaterally. Sensory-motor testing grossly intact. Tendon reflexes grossly intact.  Pysch: Alert & oriented x 3. Insight and judgement nl & appropriate. No ideations.  Assessment and Plan:  1. Hypertension - Continue monitor blood pressure at home. Continue diet/meds same.  2. Hyperlipidemia - Continue diet/meds, exercise,& lifestyle modifications. Continue monitor periodic cholesterol/liver & renal functions    3. T2 NIDDM - continue recommend prudent low glycemic diet, weight control, regular exercise, diabetic monitoring and periodic eye exams.  4. Vitamin D Deficiency - Continue supplementation.  5. Testosterone Deficiency - continue supplementation  Recommended regular exercise, BP monitoring, weight control, and discussed med and SE's. Recommended labs to assess and monitor clinical status. Further disposition pending results of labs.

## 2013-05-18 NOTE — Patient Instructions (Signed)

## 2013-05-19 ENCOUNTER — Ambulatory Visit (INDEPENDENT_AMBULATORY_CARE_PROVIDER_SITE_OTHER): Payer: BC Managed Care – PPO | Admitting: Internal Medicine

## 2013-05-19 ENCOUNTER — Encounter: Payer: Self-pay | Admitting: Internal Medicine

## 2013-05-19 VITALS — BP 160/100 | HR 76 | Temp 98.4°F | Resp 16 | Ht 67.5 in | Wt 186.6 lb

## 2013-05-19 DIAGNOSIS — E119 Type 2 diabetes mellitus without complications: Secondary | ICD-10-CM

## 2013-05-19 DIAGNOSIS — E785 Hyperlipidemia, unspecified: Secondary | ICD-10-CM

## 2013-05-19 DIAGNOSIS — Z79899 Other long term (current) drug therapy: Secondary | ICD-10-CM

## 2013-05-19 DIAGNOSIS — E559 Vitamin D deficiency, unspecified: Secondary | ICD-10-CM

## 2013-05-19 DIAGNOSIS — I10 Essential (primary) hypertension: Secondary | ICD-10-CM

## 2013-05-19 LAB — CBC WITH DIFFERENTIAL/PLATELET
BASOS PCT: 0 % (ref 0–1)
Basophils Absolute: 0 10*3/uL (ref 0.0–0.1)
EOS ABS: 0.2 10*3/uL (ref 0.0–0.7)
EOS PCT: 2 % (ref 0–5)
HCT: 39.9 % (ref 39.0–52.0)
HEMOGLOBIN: 14.2 g/dL (ref 13.0–17.0)
LYMPHS ABS: 1.6 10*3/uL (ref 0.7–4.0)
Lymphocytes Relative: 20 % (ref 12–46)
MCH: 29.4 pg (ref 26.0–34.0)
MCHC: 35.6 g/dL (ref 30.0–36.0)
MCV: 82.6 fL (ref 78.0–100.0)
MONO ABS: 0.8 10*3/uL (ref 0.1–1.0)
MONOS PCT: 10 % (ref 3–12)
NEUTROS PCT: 68 % (ref 43–77)
Neutro Abs: 5.6 10*3/uL (ref 1.7–7.7)
Platelets: 345 10*3/uL (ref 150–400)
RBC: 4.83 MIL/uL (ref 4.22–5.81)
RDW: 13.4 % (ref 11.5–15.5)
WBC: 8.2 10*3/uL (ref 4.0–10.5)

## 2013-05-19 MED ORDER — AMITRIPTYLINE HCL 50 MG PO TABS: ORAL_TABLET | ORAL | Status: DC

## 2013-05-20 LAB — LIPID PANEL
CHOL/HDL RATIO: 6.9 ratio
CHOLESTEROL: 235 mg/dL — AB (ref 0–200)
HDL: 34 mg/dL — ABNORMAL LOW (ref >39)
Triglycerides: 671 mg/dL — ABNORMAL HIGH (ref <150)

## 2013-05-20 LAB — BASIC METABOLIC PANEL WITH GFR
BUN: 27 mg/dL — AB (ref 6–23)
CALCIUM: 10 mg/dL (ref 8.4–10.5)
CO2: 24 meq/L (ref 19–32)
CREATININE: 1.47 mg/dL — AB (ref 0.50–1.35)
Chloride: 100 mEq/L (ref 96–112)
GFR, EST AFRICAN AMERICAN: 62 mL/min
GFR, Est Non African American: 54 mL/min — ABNORMAL LOW
GLUCOSE: 140 mg/dL — AB (ref 70–99)
Potassium: 4.6 mEq/L (ref 3.5–5.3)
Sodium: 135 mEq/L (ref 135–145)

## 2013-05-20 LAB — HEPATIC FUNCTION PANEL
ALBUMIN: 4.6 g/dL (ref 3.5–5.2)
ALT: 24 U/L (ref 0–53)
AST: 21 U/L (ref 0–37)
Alkaline Phosphatase: 52 U/L (ref 39–117)
BILIRUBIN DIRECT: 0.1 mg/dL (ref 0.0–0.3)
Indirect Bilirubin: 0.3 mg/dL (ref 0.2–1.2)
TOTAL PROTEIN: 7.2 g/dL (ref 6.0–8.3)
Total Bilirubin: 0.4 mg/dL (ref 0.2–1.2)

## 2013-05-20 LAB — TSH: TSH: 2.18 u[IU]/mL (ref 0.350–4.500)

## 2013-05-20 LAB — MAGNESIUM: MAGNESIUM: 1.6 mg/dL (ref 1.5–2.5)

## 2013-05-20 LAB — VITAMIN D 25 HYDROXY (VIT D DEFICIENCY, FRACTURES): VIT D 25 HYDROXY: 98 ng/mL — AB (ref 30–89)

## 2013-05-20 LAB — HEMOGLOBIN A1C
HEMOGLOBIN A1C: 8.7 % — AB (ref <5.7)
MEAN PLASMA GLUCOSE: 203 mg/dL — AB (ref <117)

## 2013-05-20 LAB — INSULIN, FASTING: Insulin fasting, serum: 33 u[IU]/mL — ABNORMAL HIGH (ref 3–28)

## 2013-06-05 ENCOUNTER — Telehealth: Payer: Self-pay | Admitting: Internal Medicine

## 2013-06-05 ENCOUNTER — Other Ambulatory Visit: Payer: Self-pay | Admitting: Internal Medicine

## 2013-06-05 DIAGNOSIS — F411 Generalized anxiety disorder: Secondary | ICD-10-CM

## 2013-06-05 MED ORDER — CLONAZEPAM 2 MG PO TABS: ORAL_TABLET | ORAL | Status: DC

## 2013-06-05 NOTE — Telephone Encounter (Signed)
PATIENT CALLED AND REQUESTED RENEWAL ON SLEEPING MEDICINE YOU RX AT [facility] AND  PHARM TO BE CHANGED TO [facility] FOR ALL RXS  REQUESTING:  clonazePAM (KLONOPIN) 2 MG tablet [[id number]]    Dose: 2 mg Route: Oral Frequency: 3 times daily PRN for anxiety  Dispense Quantity:  90 tablet** PT REQUESTING 90 DAY SUPPLY  Refills:  0 Fills Remaining:  0             Sig: Take 1 tablet (2 mg total) by mouth 3 (three) times daily as needed for anxiety. 1/2-1 tab           Written Date:  05/07/13 Expiration Date:  11/03/13      Start Date:  05/07/13 End Date:  --      Ordering Provider:  -- Authorizing Provider:  Ardis Hughs, PA-C Ordering User:  Ardis Hughs, PA-C                          Appointment Staff    Staff Department    Ardis Hughs, PA-C [place]                   Order Questions    Question Answer Comment    Supervising Provider Unk Pinto             Original Order:  clonazePAM Bobbye Charleston) 2 MG tablet [[id number]]         Pharmacy:  [facility], [place] [address]        Pharmacy Comments:  --           Quantity Remaining:  0 tablet Quantity Filled:  0 tablet

## 2013-06-17 ENCOUNTER — Other Ambulatory Visit: Payer: Self-pay

## 2013-06-17 MED ORDER — AMITRIPTYLINE HCL 50 MG PO TABS: ORAL_TABLET | ORAL | Status: DC

## 2013-06-18 ENCOUNTER — Other Ambulatory Visit: Payer: Self-pay

## 2013-06-18 MED ORDER — AMITRIPTYLINE HCL 50 MG PO TABS: ORAL_TABLET | ORAL | Status: DC

## 2013-06-25 ENCOUNTER — Other Ambulatory Visit: Payer: Self-pay | Admitting: *Deleted

## 2013-06-25 DIAGNOSIS — F411 Generalized anxiety disorder: Secondary | ICD-10-CM

## 2013-06-25 MED ORDER — CLONAZEPAM 2 MG PO TABS: ORAL_TABLET | ORAL | Status: DC

## 2013-07-20 ENCOUNTER — Other Ambulatory Visit: Payer: Self-pay | Admitting: Emergency Medicine

## 2013-08-29 ENCOUNTER — Ambulatory Visit: Payer: Self-pay | Admitting: Emergency Medicine

## 2013-09-15 ENCOUNTER — Other Ambulatory Visit: Payer: Self-pay | Admitting: Physician Assistant

## 2013-09-15 MED ORDER — TESTOSTERONE CYPIONATE 200 MG/ML IM SOLN: INTRAMUSCULAR | Status: DC

## 2013-10-14 ENCOUNTER — Ambulatory Visit (INDEPENDENT_AMBULATORY_CARE_PROVIDER_SITE_OTHER): Payer: BC Managed Care – PPO | Admitting: Internal Medicine

## 2013-10-14 ENCOUNTER — Encounter: Payer: Self-pay | Admitting: Internal Medicine

## 2013-10-14 ENCOUNTER — Encounter (INDEPENDENT_AMBULATORY_CARE_PROVIDER_SITE_OTHER): Payer: Self-pay

## 2013-10-14 VITALS — BP 172/98 | HR 92 | Temp 99.2°F | Resp 16 | Ht 67.5 in | Wt 193.2 lb

## 2013-10-14 DIAGNOSIS — E291 Testicular hypofunction: Secondary | ICD-10-CM

## 2013-10-14 DIAGNOSIS — E349 Endocrine disorder, unspecified: Secondary | ICD-10-CM

## 2013-10-14 DIAGNOSIS — E785 Hyperlipidemia, unspecified: Secondary | ICD-10-CM

## 2013-10-14 DIAGNOSIS — I10 Essential (primary) hypertension: Secondary | ICD-10-CM

## 2013-10-14 DIAGNOSIS — E119 Type 2 diabetes mellitus without complications: Secondary | ICD-10-CM

## 2013-10-14 DIAGNOSIS — Z79899 Other long term (current) drug therapy: Secondary | ICD-10-CM

## 2013-10-14 DIAGNOSIS — E559 Vitamin D deficiency, unspecified: Secondary | ICD-10-CM

## 2013-10-14 LAB — CBC WITH DIFFERENTIAL/PLATELET
BASOS ABS: 0 10*3/uL (ref 0.0–0.1)
BASOS PCT: 0 % (ref 0–1)
EOS ABS: 0.2 10*3/uL (ref 0.0–0.7)
Eosinophils Relative: 2 % (ref 0–5)
HCT: 52.3 % — ABNORMAL HIGH (ref 39.0–52.0)
HEMOGLOBIN: 17.8 g/dL — AB (ref 13.0–17.0)
Lymphocytes Relative: 16 % (ref 12–46)
Lymphs Abs: 1.6 10*3/uL (ref 0.7–4.0)
MCH: 29.2 pg (ref 26.0–34.0)
MCHC: 34 g/dL (ref 30.0–36.0)
MCV: 85.9 fL (ref 78.0–100.0)
MONO ABS: 1.1 10*3/uL — AB (ref 0.1–1.0)
MONOS PCT: 11 % (ref 3–12)
NEUTROS PCT: 71 % (ref 43–77)
Neutro Abs: 7 10*3/uL (ref 1.7–7.7)
Platelets: 287 10*3/uL (ref 150–400)
RBC: 6.09 MIL/uL — ABNORMAL HIGH (ref 4.22–5.81)
RDW: 13.7 % (ref 11.5–15.5)
WBC: 9.9 10*3/uL (ref 4.0–10.5)

## 2013-10-14 LAB — BASIC METABOLIC PANEL WITH GFR
BUN: 13 mg/dL (ref 6–23)
CALCIUM: 9.2 mg/dL (ref 8.4–10.5)
CO2: 26 mEq/L (ref 19–32)
Chloride: 100 mEq/L (ref 96–112)
Creat: 1.05 mg/dL (ref 0.50–1.35)
GFR, Est Non African American: 81 mL/min
GLUCOSE: 288 mg/dL — AB (ref 70–99)
Potassium: 4.1 mEq/L (ref 3.5–5.3)
Sodium: 133 mEq/L — ABNORMAL LOW (ref 135–145)

## 2013-10-14 LAB — HEPATIC FUNCTION PANEL
ALBUMIN: 4.3 g/dL (ref 3.5–5.2)
ALT: 25 U/L (ref 0–53)
AST: 16 U/L (ref 0–37)
Alkaline Phosphatase: 82 U/L (ref 39–117)
Bilirubin, Direct: 0.1 mg/dL (ref 0.0–0.3)
Indirect Bilirubin: 0.4 mg/dL (ref 0.2–1.2)
TOTAL PROTEIN: 7 g/dL (ref 6.0–8.3)
Total Bilirubin: 0.5 mg/dL (ref 0.2–1.2)

## 2013-10-14 LAB — LIPID PANEL
CHOLESTEROL: 195 mg/dL (ref 0–200)
HDL: 29 mg/dL — ABNORMAL LOW (ref >39)
Total CHOL/HDL Ratio: 6.7 Ratio
Triglycerides: 643 mg/dL — ABNORMAL HIGH (ref <150)

## 2013-10-14 LAB — HEMOGLOBIN A1C
HEMOGLOBIN A1C: 9.5 % — AB (ref <5.7)
MEAN PLASMA GLUCOSE: 226 mg/dL — AB (ref <117)

## 2013-10-14 LAB — MAGNESIUM: MAGNESIUM: 1.9 mg/dL (ref 1.5–2.5)

## 2013-10-14 LAB — TESTOSTERONE: Testosterone: 2239.56 ng/dL — ABNORMAL HIGH (ref 300–890)

## 2013-10-14 LAB — TSH: TSH: 4.058 u[IU]/mL (ref 0.350–4.500)

## 2013-10-14 MED ORDER — LISINOPRIL 40 MG PO TABS: ORAL_TABLET | ORAL | Status: DC

## 2013-10-14 MED ORDER — NADOLOL 80 MG PO TABS: ORAL_TABLET | ORAL | Status: DC

## 2013-10-14 NOTE — Patient Instructions (Signed)

## 2013-10-14 NOTE — Progress Notes (Signed)
Patient ID: Hunter Fields, male   DOB: 11/15/1960, 53 y.o.   MRN: [id number]   This very nice 53 y.o.MWM presents for 3 month follow up with Hypertension, Hyperlipidemia, T2_NIDDM, Testosterone  and Vitamin D Deficiency.    Patient is treated for HTN & BP has been controlled at home. Today's BP: 172/98 mmHg. Patient denies any cardiac type chest pain, palpitations, dyspnea/orthopnea/PND, dizziness, claudication, or dependent edema.   Hyperlipidemia is not controlled with diet & meds. Patient denies myalgias or other med SE's. Last Lipids were not at goal with Total Chol 235*; HDL  34*; LDL  NOT CALC; Trig 671 on 05/19/2013.   Also, the patient has history of T2_NIDDM (2002) and Stage 2 CKD (GFR 62 ml/min) reports recent FBG's range less than 100 mg%. He denies any symptoms of reactive hypoglycemia, diabetic polys, paresthesias or visual blurring.  Patient admits poor dietary compliance due to work schedule and last A1c was not at goal - 8.7% on 05/19/2013.    Further, Patient has history of Vitamin D Deficiency (40 in 2008) and patient supplements vitamin D without any suspected side-effects. Last vitamin D was 98 on 05/19/2013   Medication List   amitriptyline 50 MG tablet  Commonly known as:  ELAVIL  Take 4 tablets daily as needed     b complex vitamins tablet  Take 1 tablet by mouth daily.     clonazePAM 2 MG tablet  Commonly known as:  KLONOPIN  1/2-1 tab tid prn anxiety or tremor     fenofibrate micronized 134 MG capsule  Commonly known as:  LOFIBRA  TAKE 1 CAPSULE DAILY FOR BLOOD FATS     FISH OIL PO  Take 1,400 mg by mouth daily.     FLAXSEED OIL PO  Take by mouth. Takes 1 capsule daily     metFORMIN 500 MG 24 hr tablet  Commonly known as:  GLUCOPHAGE-XR  TAKE 2 TABLETS TWICE A DAY FOR DIABETES MELLITUS     multivitamin tablet  Take 1 tablet by mouth daily.     testosterone cypionate 200 MG/ML injection  Commonly known as:  DEPOTESTOTERONE CYPIONATE  INJECT 2 CC  INTRAMUSCULARLY EVERY 2 WEEKS OR AS DIRECTED     VITAMIN B 12 PO  Take 1,000 mcg by mouth daily.     Vitamin D 2000 UNITS tablet  Take 2,000 Units by mouth daily.     Allergies  Allergen Reactions  . Food Swelling    MUSHROOMS=SWELLING   PMHx:   Past Medical History  Diagnosis Date  . Allergy   . Diabetes mellitus   . Anxiety   . Testosterone deficiency   . Hyperlipidemia   . Hypertension   . Hypothyroid    FHx:    Reviewed / unchanged SHx:    Reviewed / unchanged  Systems Review:  Constitutional: Denies fever, chills, wt changes, headaches, insomnia, fatigue, night sweats, change in appetite. Eyes: Denies redness, blurred vision, diplopia, discharge, itchy, watery eyes.  ENT: Denies discharge, congestion, post nasal drip, epistaxis, sore throat, earache, hearing loss, dental pain, tinnitus, vertigo, sinus pain, snoring.  CV: Denies chest pain, palpitations, irregular heartbeat, syncope, dyspnea, diaphoresis, orthopnea, PND, claudication or edema. Respiratory: denies cough, dyspnea, DOE, pleurisy, hoarseness, laryngitis, wheezing.  Gastrointestinal: Denies dysphagia, odynophagia, heartburn, reflux, water brash, abdominal pain or cramps, nausea, vomiting, bloating, diarrhea, constipation, hematemesis, melena, hematochezia  or hemorrhoids. Genitourinary: Denies dysuria, frequency, urgency, nocturia, hesitancy, discharge, hematuria or flank pain. Musculoskeletal: Denies arthralgias, myalgias, stiffness, jt. swelling, pain,  limping or strain/sprain.  Skin: Denies pruritus, rash, hives, warts, acne, eczema or change in skin lesion(s). Neuro: No weakness, tremor, incoordination, spasms, paresthesia or pain. Psychiatric: Denies confusion, memory loss or sensory loss. Endo: Denies change in weight, skin or hair change.  Heme/Lymph: No excessive bleeding, bruising or enlarged lymph nodes.  Exam:  BP 172/98 & rechecked at 185/118  Pulse 92  Temp 99.2 F Resp 16  Ht 5' 7.5"    Wt 193 lb 3.2 oz   BMI 29.80   Appears well nourished and in no distress. Eyes: PERRLA, EOMs, conjunctiva no swelling or erythema. Sinuses: No frontal/maxillary tenderness ENT/Mouth: EAC's clear, TM's nl w/o erythema, bulging. Nares clear w/o erythema, swelling, exudates. Oropharynx clear without erythema or exudates. Oral hygiene is good. Tongue normal, non obstructing. Hearing intact.  Neck: Supple. Thyroid nl. Car 2+/2+ without bruits, nodes or JVD. Chest: Respirations nl with BS clear & equal w/o rales, rhonchi, wheezing or stridor.  Cor: Heart sounds normal w/ regular rate and rhythm without sig. murmurs, gallops, clicks, or rubs. Peripheral pulses normal and equal  without edema.  Abdomen: Soft & bowel sounds normal. Non-tender w/o guarding, rebound, hernias, masses, or organomegaly.  Lymphatics: Unremarkable.  Musculoskeletal: Full ROM all peripheral extremities, joint stability, 5/5 strength, and normal gait.  Skin: Warm, dry without exposed rashes, lesions or ecchymosis apparent.  Neuro: Cranial nerves intact, reflexes equal bilaterally. Sensory-motor testing grossly intact. Tendon reflexes grossly intact.  Pysch: Alert & oriented x 3.  Insight and judgement nl & appropriate. No ideations.  Assessment and Plan:  1. Hypertension - Continue monitor blood pressure at home. Continue diet/meds same.  2. Hyperlipidemia - Continue diet/meds, exercise,& lifestyle modifications. Continue monitor periodic cholesterol/liver & renal functions   3. T2_NIDDM - Continue diet, exercise, lifestyle modifications. Monitor appropriate labs.  4. Vitamin D Deficiency - Continue supplementation.  5. Testosterone Deficiency - Continue supplementation   Recommended regular exercise, BP monitoring, weight control, and discussed med and SE's. Recommended labs to assess and monitor clinical status. Further disposition pending results of labs.  Rx Corgard 80 mg for BP and tremor  And Rx Lisinopril 40 mg  for BP & renal protection - recc begin each at 1/2 dose til ROV 1 mo.

## 2013-10-15 LAB — VITAMIN D 25 HYDROXY (VIT D DEFICIENCY, FRACTURES): VIT D 25 HYDROXY: 51 ng/mL (ref 30–89)

## 2013-10-15 LAB — INSULIN, FASTING: INSULIN FASTING, SERUM: 21.9 u[IU]/mL — AB (ref 2.0–19.6)

## 2013-10-20 ENCOUNTER — Other Ambulatory Visit: Payer: Self-pay | Admitting: *Deleted

## 2013-10-20 DIAGNOSIS — F411 Generalized anxiety disorder: Secondary | ICD-10-CM

## 2013-10-20 MED ORDER — GLYBURIDE 5 MG PO TABS: 5.0000 mg | ORAL_TABLET | Freq: Every day | ORAL | Status: DC

## 2013-10-20 MED ORDER — CLONAZEPAM 2 MG PO TABS: ORAL_TABLET | ORAL | Status: DC

## 2013-10-20 MED ORDER — GLYBURIDE 5 MG PO TABS: ORAL_TABLET | ORAL | Status: DC

## 2013-10-22 ENCOUNTER — Telehealth: Payer: Self-pay | Admitting: *Deleted

## 2013-10-22 MED ORDER — PROPRANOLOL HCL 80 MG PO TABS: 80.0000 mg | ORAL_TABLET | Freq: Two times a day (BID) | ORAL | Status: DC

## 2013-10-22 NOTE — Telephone Encounter (Signed)
Patient called and states Nadolol is too expensive and new RX for Propranolol 80 mg BID sent in per Dr Melford Aase.

## 2013-10-23 ENCOUNTER — Other Ambulatory Visit: Payer: Self-pay | Admitting: *Deleted

## 2013-10-23 MED ORDER — FENOFIBRATE MICRONIZED 134 MG PO CAPS: ORAL_CAPSULE | ORAL | Status: DC

## 2013-11-18 ENCOUNTER — Other Ambulatory Visit: Payer: Self-pay | Admitting: Internal Medicine

## 2013-11-20 ENCOUNTER — Ambulatory Visit: Payer: Self-pay | Admitting: Internal Medicine

## 2013-12-02 ENCOUNTER — Other Ambulatory Visit: Payer: Self-pay | Admitting: Internal Medicine

## 2013-12-03 ENCOUNTER — Other Ambulatory Visit: Payer: Self-pay | Admitting: Physician Assistant

## 2013-12-03 MED ORDER — TESTOSTERONE CYPIONATE 200 MG/ML IM SOLN: INTRAMUSCULAR | Status: DC

## 2013-12-19 ENCOUNTER — Encounter: Payer: Self-pay | Admitting: Internal Medicine

## 2014-01-06 ENCOUNTER — Encounter: Payer: Self-pay | Admitting: Internal Medicine

## 2014-01-06 ENCOUNTER — Other Ambulatory Visit: Payer: Self-pay | Admitting: *Deleted

## 2014-01-06 MED ORDER — LISINOPRIL 40 MG PO TABS: ORAL_TABLET | ORAL | Status: DC

## 2014-01-12 ENCOUNTER — Encounter: Payer: Self-pay | Admitting: Internal Medicine

## 2014-01-12 ENCOUNTER — Ambulatory Visit (INDEPENDENT_AMBULATORY_CARE_PROVIDER_SITE_OTHER): Payer: BC Managed Care – PPO | Admitting: Internal Medicine

## 2014-01-12 VITALS — BP 158/100 | HR 76 | Temp 98.8°F | Resp 16 | Ht 67.5 in | Wt 193.0 lb

## 2014-01-12 DIAGNOSIS — I10 Essential (primary) hypertension: Secondary | ICD-10-CM

## 2014-01-12 DIAGNOSIS — E559 Vitamin D deficiency, unspecified: Secondary | ICD-10-CM

## 2014-01-12 DIAGNOSIS — E290 Testicular hyperfunction: Secondary | ICD-10-CM

## 2014-01-12 DIAGNOSIS — E119 Type 2 diabetes mellitus without complications: Secondary | ICD-10-CM

## 2014-01-12 DIAGNOSIS — E785 Hyperlipidemia, unspecified: Secondary | ICD-10-CM

## 2014-01-12 DIAGNOSIS — Z79899 Other long term (current) drug therapy: Secondary | ICD-10-CM

## 2014-01-12 LAB — HEPATIC FUNCTION PANEL
ALBUMIN: 4.3 g/dL (ref 3.5–5.2)
ALT: 23 U/L (ref 0–53)
AST: 19 U/L (ref 0–37)
Alkaline Phosphatase: 50 U/L (ref 39–117)
BILIRUBIN INDIRECT: 0.3 mg/dL (ref 0.2–1.2)
Bilirubin, Direct: 0.1 mg/dL (ref 0.0–0.3)
TOTAL PROTEIN: 7.1 g/dL (ref 6.0–8.3)
Total Bilirubin: 0.4 mg/dL (ref 0.2–1.2)

## 2014-01-12 LAB — BASIC METABOLIC PANEL WITH GFR
BUN: 15 mg/dL (ref 6–23)
CO2: 24 mEq/L (ref 19–32)
Calcium: 9.8 mg/dL (ref 8.4–10.5)
Chloride: 104 mEq/L (ref 96–112)
Creat: 1.18 mg/dL (ref 0.50–1.35)
GFR, EST AFRICAN AMERICAN: 81 mL/min
GFR, EST NON AFRICAN AMERICAN: 70 mL/min
Glucose, Bld: 82 mg/dL (ref 70–99)
POTASSIUM: 3.9 meq/L (ref 3.5–5.3)
Sodium: 137 mEq/L (ref 135–145)

## 2014-01-12 LAB — CBC WITH DIFFERENTIAL/PLATELET
BASOS ABS: 0 10*3/uL (ref 0.0–0.1)
BASOS PCT: 0 % (ref 0–1)
Eosinophils Absolute: 0.2 10*3/uL (ref 0.0–0.7)
Eosinophils Relative: 2 % (ref 0–5)
HCT: 50.3 % (ref 39.0–52.0)
Hemoglobin: 17.9 g/dL — ABNORMAL HIGH (ref 13.0–17.0)
Lymphocytes Relative: 20 % (ref 12–46)
Lymphs Abs: 1.7 10*3/uL (ref 0.7–4.0)
MCH: 29.8 pg (ref 26.0–34.0)
MCHC: 35.6 g/dL (ref 30.0–36.0)
MCV: 83.8 fL (ref 78.0–100.0)
MONO ABS: 1.1 10*3/uL — AB (ref 0.1–1.0)
MPV: 9.5 fL (ref 9.4–12.4)
Monocytes Relative: 13 % — ABNORMAL HIGH (ref 3–12)
NEUTROS ABS: 5.6 10*3/uL (ref 1.7–7.7)
NEUTROS PCT: 65 % (ref 43–77)
PLATELETS: 297 10*3/uL (ref 150–400)
RBC: 6 MIL/uL — ABNORMAL HIGH (ref 4.22–5.81)
RDW: 14.4 % (ref 11.5–15.5)
WBC: 8.6 10*3/uL (ref 4.0–10.5)

## 2014-01-12 LAB — LIPID PANEL
CHOLESTEROL: 225 mg/dL — AB (ref 0–200)
HDL: 30 mg/dL — ABNORMAL LOW (ref >39)
TRIGLYCERIDES: 578 mg/dL — AB (ref <150)
Total CHOL/HDL Ratio: 7.5 Ratio

## 2014-01-12 LAB — TSH: TSH: 5.632 u[IU]/mL — ABNORMAL HIGH (ref 0.350–4.500)

## 2014-01-12 LAB — MAGNESIUM: MAGNESIUM: 1.7 mg/dL (ref 1.5–2.5)

## 2014-01-12 NOTE — Patient Instructions (Signed)

## 2014-01-12 NOTE — Progress Notes (Signed)
Patient ID: Hunter Fields, male   DOB: 1960/08/22, 53 y.o.   MRN: [id number]   This very nice 53 y.o. Separating WM presents for 3 month follow up with Hypertension, Hyperlipidemia, Pre-Diabetes, Testosterone and Vitamin D Deficiency.    Patient is treated for HTN & BP has been controlled at home. Today's BP was high at 158/100 mmHg. Patient has had no complaints of any cardiac type chest pain, palpitations, dyspnea/orthopnea/PND, dizziness, claudication, or dependent edema.   Hyperlipidemia is controlled with diet & meds. Patient denies myalgias or other med SE's. Last Lipids were Total Chol 195; HDL  29*; LDL  NOT CALC; Trig 643* on 10/14/2013.   Also, the patient has history of T2_NIDDM and has had no symptoms of reactive hypoglycemia, diabetic polys, paresthesias or visual blurring.  Last A1c was  9.5% on  10/14/2013.   Further, the patient also has history of Vitamin D Deficiency and supplements vitamin D without any suspected side-effects. Last vitamin D was  51 on  10/14/2013.   Medication List   clonazePAM 2 MG tablet  Commonly known as:  KLONOPIN  1/2-1 tab tid prn anxiety or tremor     fenofibrate micronized 134 MG capsule  Commonly known as:  LOFIBRA  TAKE 1 CAPSULE DAILY FOR BLOOD FATS     FISH OIL PO  Take 1,400 mg by mouth daily.     FLAXSEED OIL PO  Take 1,200 mg by mouth. Takes 1 capsule daily     glyBURIDE 5 MG tablet  Commonly known as:  DIABETA  Takes 1/2 to 1 tab po BID-TID daily for diabetes     lisinopril 40 MG tablet  Commonly known as:  PRINIVIL,ZESTRIL  TAKE ONE TABLET BY MOUTH DAILY FOR BLOOD PRESSURE AND KIDNEY PROTECTION     metFORMIN 500 MG XR  24 hr tablet  Commonly known as:  GLUCOPHAGE-XR  TAKE 2 TABLETS TWICE A DAY FOR DIABETES MELLITUS     multivitamin tablet  Take 1 tablet by mouth daily.     nadolol 80 MG tablet  Commonly known as:  CORGARD  Take 1/2 to 1 tablet daily as directed for BP & Tremor     propranolol 80 MG tablet  Commonly  known as:  INDERAL  Take 1 tablet (80 mg total) by mouth 2 (two) times daily.     testosterone cypionate 200 MG/ML injection  Commonly known as:  DEPOTESTOTERONE CYPIONATE  INJECT 2 CC INTRAMUSCULARLY EVERY 2 WEEKS OR AS DIRECTED     VITAMIN B 12 PO  Take 1,000 mcg by mouth daily.     Vitamin D 2000 UNITS tablet  Take 2,000 Units by mouth daily.     Allergies  Allergen Reactions  . Food Swelling    MUSHROOMS=SWELLING   PMHx:   Past Medical History  Diagnosis Date  . Allergy   . Diabetes mellitus   . Anxiety   . Testosterone deficiency   . Hyperlipidemia   . Hypertension   . Hypothyroid    Immunization History  Administered Date(s) Administered  . Pneumococcal Polysaccharide-23 11/11/2012   Past Surgical History  Procedure Laterality Date  . Colon surgery  2006    large diverticulum resected  . Appendectomy  2006  . Colonoscopy  09/23/2010    diverticulosis, lymphoid rectal polyp   FHx:    Reviewed / unchanged  SHx:    Reviewed / unchanged  Systems Review:  Constitutional: Denies fever, chills, wt changes, headaches, insomnia, fatigue, night sweats, change in  appetite. Eyes: Denies redness, blurred vision, diplopia, discharge, itchy, watery eyes.  ENT: Denies discharge, congestion, post nasal drip, epistaxis, sore throat, earache, hearing loss, dental pain, tinnitus, vertigo, sinus pain, snoring.  CV: Denies chest pain, palpitations, irregular heartbeat, syncope, dyspnea, diaphoresis, orthopnea, PND, claudication or edema. Respiratory: denies cough, dyspnea, DOE, pleurisy, hoarseness, laryngitis, wheezing.  Gastrointestinal: Denies dysphagia, odynophagia, heartburn, reflux, water brash, abdominal pain or cramps, nausea, vomiting, bloating, diarrhea, constipation, hematemesis, melena, hematochezia  or hemorrhoids. Genitourinary: Denies dysuria, frequency, urgency, nocturia, hesitancy, discharge, hematuria or flank pain. Musculoskeletal: Denies arthralgias, myalgias,  stiffness, jt. swelling, pain, limping or strain/sprain.  Skin: Denies pruritus, rash, hives, warts, acne, eczema or change in skin lesion(s). Neuro: No weakness, tremor, incoordination, spasms, paresthesia or pain. Psychiatric: Denies confusion, memory loss or sensory loss. Endo: Denies change in weight, skin or hair change.  Heme/Lymph: No excessive bleeding, bruising or enlarged lymph nodes.  Physical Exam  BP 158/100   Pulse 76  Temp 98.8 F  Resp 16  Ht 5' 7.5"   Wt 193 lb          BMI 29.76  Appears well nourished and in no distress. Eyes: PERRLA, EOMs, conjunctiva no swelling or erythema. Sinuses: No frontal/maxillary tenderness ENT/Mouth: EAC's clear, TM's nl w/o erythema, bulging. Nares clear w/o erythema, swelling, exudates. Oropharynx clear without erythema or exudates. Oral hygiene is good. Tongue normal, non obstructing. Hearing intact.  Neck: Supple. Thyroid nl. Car 2+/2+ without bruits, nodes or JVD. Chest: Respirations nl with BS clear & equal w/o rales, rhonchi, wheezing or stridor.  Cor: Heart sounds normal w/ regular rate and rhythm without sig. murmurs, gallops, clicks, or rubs. Peripheral pulses normal and equal  without edema. BP rechecked at 172 / 119 with the right wrist cuff. Abdomen: Soft & bowel sounds normal. Non-tender w/o guarding, rebound, hernias, masses, or organomegaly.  Lymphatics: Unremarkable.  Musculoskeletal: Full ROM all peripheral extremities, joint stability, 5/5 strength, and normal gait.  Skin: Warm, dry without exposed rashes, lesions or ecchymosis apparent.  Neuro: Cranial nerves intact, reflexes equal bilaterally. Sensory-motor testing grossly intact. Tendon reflexes grossly intact.  Pysch: Alert & oriented x 3.  Insight and judgement nl & appropriate. No ideations.  Assessment and Plan:  1. Hypertension - Continue monitor blood pressure at home. Continue diet/meds sam and given additional Rx Benicar/Hctz 40/25 .  2. Hyperlipidemia -  Continue diet/meds, exercise,& lifestyle modifications. Continue monitor periodic cholesterol/liver & renal functions   3. T2_NIDDM  /CKD2- Continue diet, exercise, lifestyle modifications. Monitor appropriate labs.  4. Vitamin D Deficiency - Continue supplementation.   Recommended regular exercise, BP monitoring, weight control, and discussed med and SE's. Recommended labs to assess and monitor clinical status. Further disposition pending results of labs.ROV 3 weeks.

## 2014-01-13 LAB — VITAMIN D 25 HYDROXY (VIT D DEFICIENCY, FRACTURES): Vit D, 25-Hydroxy: 33 ng/mL (ref 30–100)

## 2014-01-13 LAB — HEMOGLOBIN A1C
Hgb A1c MFr Bld: 7.1 % — ABNORMAL HIGH (ref <5.7)
Mean Plasma Glucose: 157 mg/dL — ABNORMAL HIGH (ref <117)

## 2014-01-13 LAB — INSULIN, FASTING: Insulin fasting, serum: 5 u[IU]/mL (ref 2.0–19.6)

## 2014-01-14 ENCOUNTER — Other Ambulatory Visit: Payer: Self-pay | Admitting: Internal Medicine

## 2014-01-14 DIAGNOSIS — E039 Hypothyroidism, unspecified: Secondary | ICD-10-CM

## 2014-01-14 MED ORDER — LEVOTHYROXINE SODIUM 75 MCG PO TABS: 75.0000 ug | ORAL_TABLET | Freq: Every day | ORAL | Status: DC

## 2014-01-18 ENCOUNTER — Other Ambulatory Visit: Payer: Self-pay | Admitting: Physician Assistant

## 2014-02-04 ENCOUNTER — Ambulatory Visit (INDEPENDENT_AMBULATORY_CARE_PROVIDER_SITE_OTHER): Payer: BLUE CROSS/BLUE SHIELD | Admitting: Internal Medicine

## 2014-02-04 ENCOUNTER — Encounter: Payer: Self-pay | Admitting: Internal Medicine

## 2014-02-04 VITALS — BP 136/84 | HR 76 | Temp 99.1°F | Resp 16 | Ht 67.5 in | Wt 191.0 lb

## 2014-02-04 DIAGNOSIS — I10 Essential (primary) hypertension: Secondary | ICD-10-CM

## 2014-02-04 DIAGNOSIS — J0410 Acute tracheitis without obstruction: Secondary | ICD-10-CM

## 2014-02-04 DIAGNOSIS — J0100 Acute maxillary sinusitis, unspecified: Secondary | ICD-10-CM

## 2014-02-04 MED ORDER — HYDROCODONE-ACETAMINOPHEN 5-325 MG PO TABS
ORAL_TABLET | ORAL | Status: DC
Start: 2014-02-04 — End: 2021-06-01

## 2014-02-04 MED ORDER — AZITHROMYCIN 250 MG PO TABS: ORAL_TABLET | ORAL | Status: DC

## 2014-02-04 MED ORDER — PREDNISONE 10 MG PO TABS: ORAL_TABLET | ORAL | Status: DC

## 2014-02-04 NOTE — Progress Notes (Signed)
Subjective:    Patient ID: Hunter Fields, male    DOB: 11-30-1960, 54 y.o.   MRN: [id number]  HPI patient returns for 3 week f/u after changing from lisinopril to Benicar/Hct & patient reports BP's are much improved , but stll running in the high Nl range of 140's/60's. Also c/o head & chest congestion and productive cough w/o fever, chills or dyspnea.   Medication Sig  . b complex vitamins tablet Take 1 tablet by mouth daily.    Marland Kitchen VITAMIN D2000 UNITS  Take 2,000 Units by mouth daily.    . clonazePAM (KLONOPIN) 2 MG tablet 1/2-1 tab tid prn anxiety or tremor  . Cyanocobalamin (VITAMIN B 12 PO) Take 1,000 mcg by mouth daily.    . fenofibrate  134 MG capsule TAKE 1 CAPSULE DAILY FOR BLOOD FATS  . FLAXSEED OIL  Take 1,200 mg by mouth. Takes 1 capsule daily  . glyBURIDE  5 MG tablet Takes 1/2 to 1 tab po BID-TID daily for diabetes  . levothyroxine75 MCG tablet Take 1 tablet (75 mcg total) by mouth daily.  . metFORMIN -XR 500 MG 24 hr tablet TAKE 2 TABLETS TWICE A DAY FOR DIABETES MELLITUS  . MULTIVITAMIN  Take 1 tablet by mouth daily.    . nadolol  80 MG tablet Take 1/2 to 1 tablet daily as directed for BP & Tremor  . FISH OIL Take 1,400 mg by mouth daily.    . propranolol ( 80 MG tablet Take 1 tablet (80 mg total) by mouth 2 (two) times daily.   Benicar/HCT 40/25  Take 1 tablet daily  .  DEPOTESTOTERONE  200 MG/ML injec inject 2 ml's intramuscularly every 2 weeks as directed by physician   Allergies  Allergen Reactions  . Food Swelling    MUSHROOMS=SWELLING   Past Medical History  Diagnosis Date  . Allergy   . Diabetes mellitus   . Anxiety   . Testosterone deficiency   . Hyperlipidemia   . Hypertension   . Hypothyroid    Review of Systems In addition to the HPI above,  No Fever-chills,  No Headache, No changes with Vision or hearing,  No problems swallowing food or Liquids,  No Chest pain or  or Shortness of Breath,  No Abdominal pain, No Nausea or Vomitting, Bowel  movements are regular,  No Blood in stool or Urine,  No dysuria,  No new skin rashes or bruises,  No new joints pains-aches,  No new weakness, tingling, numbness in any extremity,  No recent weight loss,  No polyuria, polydypsia or polyphagia,  No significant Mental Stressors.  A full 10 point Review of Systems was done, except as stated above, all other Review of Systems were negative    Objective:   Physical Exam BP 136/84 mmHg  Pulse 76  Temp(Src) 99.1 F (37.3 C)  Resp 16  Ht 5' 7.5" (1.715 m)  Wt 191 lb (86.637 kg)  BMI 29.46 kg/m2  HEENT - Eac's patent. TM's Nl. EOM's full. PERRLA. NasoOroPharynx clear. Slight Frontal/maxillary tenderness bilat. Neck - supple. Nl Thyroid. Carotids 2+ & No bruits, nodes, JVD Chest - Clear equal BS w/few scattered  Rales and no  Rhonchi or wheezes. Cor - Nl HS. RRR w/o sig MGR. PP 1(+). No edema. Abd - No palpable organomegaly, masses or tenderness. BS nl. MS- FROM w/o deformities. Muscle power, tone and bulk Nl. Gait Nl. Neuro - No obvious Cr N abnormalities. Sensory, motor and Cerebellar functions appear Nl w/o  focal abnormalities. Psyche - Mental status normal & appropriate.  No delusions, ideations or obvious mood abnormalities.    Assessment & Plan:   1. Essential hypertension   2. Acute maxillary sinusitis, recurrence not specified   3. Tracheitis  - HYDROcodone-acetaminophen (NORCO) 5-325 MG per tablet; Take 1/2 to 1 tablet every 3 to 4 hours is needed for cough or pain  Dispense: 50   - azithromycin (ZITHROMAX) 250 MG tablet; Take 2 tablets (500 mg) on  Day 1,  followed by 1 tablet (250 mg) once daily on Days 2 through 5.  Dispense: 6 each; Refill: 1  - predniSONE (DELTASONE) 10 MG tablet; 1 tab 3 x day for 3 days, then 1 tab 2 x day for 3 days, then 1 tab 1 x day for 5 days  Dispense: 20

## 2014-02-04 NOTE — Patient Instructions (Signed)

## 2014-02-10 ENCOUNTER — Other Ambulatory Visit: Payer: Self-pay | Admitting: *Deleted

## 2014-02-10 DIAGNOSIS — E039 Hypothyroidism, unspecified: Secondary | ICD-10-CM

## 2014-02-10 MED ORDER — OLMESARTAN MEDOXOMIL-HCTZ 40-25 MG PO TABS: 1.0000 | ORAL_TABLET | Freq: Every day | ORAL | Status: DC

## 2014-02-10 MED ORDER — LISINOPRIL 40 MG PO TABS: 40.0000 mg | ORAL_TABLET | Freq: Every day | ORAL | Status: DC

## 2014-02-10 MED ORDER — LEVOTHYROXINE SODIUM 75 MCG PO TABS: 75.0000 ug | ORAL_TABLET | Freq: Every day | ORAL | Status: DC

## 2014-02-10 MED ORDER — METFORMIN HCL ER 500 MG PO TB24: ORAL_TABLET | ORAL | Status: DC

## 2014-02-10 MED ORDER — FENOFIBRATE MICRONIZED 134 MG PO CAPS: ORAL_CAPSULE | ORAL | Status: DC

## 2014-02-10 MED ORDER — LEVOTHYROXINE SODIUM 75 MCG PO TABS
75.0000 ug | ORAL_TABLET | Freq: Every day | ORAL | Status: DC
Start: 2014-02-10 — End: 2014-02-10

## 2014-02-11 ENCOUNTER — Other Ambulatory Visit: Payer: Self-pay | Admitting: Internal Medicine

## 2014-02-11 MED ORDER — LOSARTAN POTASSIUM-HCTZ 100-25 MG PO TABS: 1.0000 | ORAL_TABLET | Freq: Every day | ORAL | Status: DC

## 2014-02-14 ENCOUNTER — Other Ambulatory Visit: Payer: Self-pay | Admitting: Internal Medicine

## 2014-02-25 ENCOUNTER — Telehealth: Payer: Self-pay | Admitting: *Deleted

## 2014-02-25 MED ORDER — DIAZEPAM 10 MG PO TABS: ORAL_TABLET | ORAL | Status: DC

## 2014-02-25 MED ORDER — LEVOFLOXACIN 500 MG PO TABS: 500.0000 mg | ORAL_TABLET | Freq: Every day | ORAL | Status: DC

## 2014-02-25 NOTE — Telephone Encounter (Signed)
Patient called and states he has taken 2 Z-paks and still has congestion  OK to send new RX for Levaquin per Dr Melford Aase.  Patient also request to change Clonazepam for tremor back to Valium which controled tremor .  He also requested a less expensive drug to replace his Propranolol.  Per Dr Melford Aase, Mardelle Matte to [place].  RX's sent in.

## 2014-04-02 ENCOUNTER — Ambulatory Visit (INDEPENDENT_AMBULATORY_CARE_PROVIDER_SITE_OTHER): Payer: BLUE CROSS/BLUE SHIELD | Admitting: Internal Medicine

## 2014-04-02 ENCOUNTER — Encounter: Payer: Self-pay | Admitting: Internal Medicine

## 2014-04-02 VITALS — BP 166/84 | HR 92 | Temp 98.8°F | Resp 16 | Ht 67.75 in | Wt 189.3 lb

## 2014-04-02 DIAGNOSIS — E1129 Type 2 diabetes mellitus with other diabetic kidney complication: Secondary | ICD-10-CM

## 2014-04-02 DIAGNOSIS — Z0001 Encounter for general adult medical examination with abnormal findings: Secondary | ICD-10-CM

## 2014-04-02 DIAGNOSIS — E039 Hypothyroidism, unspecified: Secondary | ICD-10-CM

## 2014-04-02 DIAGNOSIS — Z125 Encounter for screening for malignant neoplasm of prostate: Secondary | ICD-10-CM

## 2014-04-02 DIAGNOSIS — G250 Essential tremor: Secondary | ICD-10-CM

## 2014-04-02 DIAGNOSIS — Z23 Encounter for immunization: Secondary | ICD-10-CM

## 2014-04-02 DIAGNOSIS — R6889 Other general symptoms and signs: Secondary | ICD-10-CM

## 2014-04-02 DIAGNOSIS — Z1212 Encounter for screening for malignant neoplasm of rectum: Secondary | ICD-10-CM

## 2014-04-02 DIAGNOSIS — Z111 Encounter for screening for respiratory tuberculosis: Secondary | ICD-10-CM

## 2014-04-02 DIAGNOSIS — E785 Hyperlipidemia, unspecified: Secondary | ICD-10-CM

## 2014-04-02 DIAGNOSIS — E349 Endocrine disorder, unspecified: Secondary | ICD-10-CM

## 2014-04-02 DIAGNOSIS — R5383 Other fatigue: Secondary | ICD-10-CM

## 2014-04-02 DIAGNOSIS — Z79899 Other long term (current) drug therapy: Secondary | ICD-10-CM

## 2014-04-02 DIAGNOSIS — I10 Essential (primary) hypertension: Secondary | ICD-10-CM

## 2014-04-02 DIAGNOSIS — E559 Vitamin D deficiency, unspecified: Secondary | ICD-10-CM

## 2014-04-02 NOTE — Progress Notes (Signed)
Patient ID: Hunter Fields, male   DOB: Aug 15, 1960, 54 y.o.   MRN: [id number] Annual Comprehensive Examination  This very nice 54 y.o. divorcing WM presents for complete physical.  Patient has been followed for HTN, T2_NIDDM w/CKD2 , Hyperlipidemia, and Vitamin D Deficiency.   HTN predates since 54. Patient's BP has been controlled at home.Today's BP elevated at 166/84. Patient denies any cardiac symptoms as chest pain, palpitations, shortness of breath, dizziness or ankle swelling.   Patient's hyperlipidemia is controlled with diet and medications. Patient denies myalgias or other medication SE's. 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 Last A1c was 7.1% on 01/12/2014.   Finally, patient has history of Vitamin D Deficiency of 40 in 2008 and last vitamin D was 33 on 01/12/2014.  Medication Sig  . b complex vitamins tablet Take 1 tablet by mouth daily.    Marland Kitchen VITAMIN D 2000 UNITS tablet Take 2,000 Units by mouth daily.    . Cyanocobalamin (VITAMIN B 12 PO) Take 1,000 mcg by mouth daily.    . diazepam (VALIUM) 10 MG tablet Take 1/2 to 1 tablet QID for tremor  . fenofibrate micronized  134 MG capsule TAKE 1 CAPSULE DAILY FOR BLOOD FATS  . FLAXSEED OIL  Take 1,200 mg by mouth. Takes 1 capsule daily  . glyBURIDE  5 MG tablet Takes 1/2 to 1 tab po BID-TID daily for diabetes  . NORCO 5-325 MG per tablet Take 1/2 to 1 tablet every 3 to 4 hours is needed for cough or pain  . levofloxacin (LEVAQUIN) 500 MG tablet Take 1 tablet (500 mg total) by mouth daily.  Marland Kitchen levothyroxine (SYNTHROID) 75 MCG tablet Take 1 tablet (75 mcg total) by mouth daily.  Marland Kitchen lisinopril (PRINIVIL,ZESTRIL) 40 MG tablet Take 1 tablet (40 mg total) by mouth daily.  Marland Kitchen losartan-hctz (HYZAAR) 100-25 MG per  tablet Take 1 tablet by mouth daily.  . metFORMIN -XR 500 MG 24 hr tablet TAKE 2 TABLETS TWICE A DAY FOR DIABETES MELLITUS  . MULTIVITAMIN Take 1 tablet by mouth daily.    . nadolol  80 MG tablet Take 1/2 to 1 tablet daily as directed for BP & Tremor  . FISH OIL Take 1,400 mg by mouth daily.    Marland Kitchen DEPO-TESTOTERONE  200 MG/ML injection inject 2 ml's intramuscularly every 2 weeks as directed by physician   Allergies  Allergen Reactions  . Food Swelling    MUSHROOMS=SWELLING   Past Medical History  Diagnosis Date  . Allergy   . Diabetes mellitus   . Anxiety   . Testosterone deficiency   . Hyperlipidemia   . Hypertension   . Hypothyroid    Health Maintenance  Topic Date Due  . FOOT EXAM  04/10/1970  . OPHTHALMOLOGY EXAM  04/10/1970  . URINE MICROALBUMIN  04/10/1970  . HIV Screening  04/10/1975  . TETANUS/TDAP  04/10/1979  . INFLUENZA VACCINE  08/30/2013  . HEMOGLOBIN A1C  07/14/2014  . PNEUMOCOCCAL POLYSACCHARIDE VACCINE (2) 11/11/2017  . COLONOSCOPY  09/22/2020   Immunization History  Administered Date(s) Administered  . DT 04/02/2014  . PPD Test 04/02/2014  . Pneumococcal Polysaccharide-23 11/11/2012   Past Surgical History  Procedure Laterality Date  . Colon surgery  2006    large diverticulum resected  .  Appendectomy  2006  . Colonoscopy  09/23/2010    diverticulosis, lymphoid rectal polyp   History   Social History  . Marital Status: Legally Separated    Spouse Name: N/A  . Number of Children: N/A  . Years of Education: N/A   Occupational History  . Store mgr - Designer, television/film set   Social History Main Topics  . Smoking status: Former Smoker    Quit date: 01/31/1995  . Smokeless tobacco: Never Used  . Alcohol Use: Yes     Comment: OCC WINE OR BEER,MAYBE MONTHLY  . Drug Use: No  . Sexual Activity: Not on file    ROS Constitutional: Denies fever, chills, weight loss/gain, headaches, insomnia, fatigue, night sweats or change in appetite. Eyes: Denies  redness, blurred vision, diplopia, discharge, itchy or watery eyes.  ENT: Denies discharge, congestion, post nasal drip, epistaxis, sore throat, earache, hearing loss, dental pain, Tinnitus, Vertigo, Sinus pain or snoring.  Cardio: Denies chest pain, palpitations, irregular heartbeat, syncope, dyspnea, diaphoresis, orthopnea, PND, claudication or edema Respiratory: denies cough, dyspnea, DOE, pleurisy, hoarseness, laryngitis or wheezing.  Gastrointestinal: Denies dysphagia, heartburn, reflux, water brash, pain, cramps, nausea, vomiting, bloating, diarrhea, constipation, hematemesis, melena, hematochezia, jaundice or hemorrhoids Genitourinary: Denies dysuria, frequency, urgency, nocturia, hesitancy, discharge, hematuria or flank pain Musculoskeletal: Denies arthralgia, myalgia, stiffness, Jt. Swelling, pain, limp or strain/sprain. Denies Falls. Skin: Denies puritis, rash, hives, warts, acne, eczema or change in skin lesion Neuro: No weakness, tremor, incoordination, spasms, paresthesia or pain Psychiatric: Denies confusion, memory loss or sensory loss. Denies Depression. Endocrine: Denies change in weight, skin, hair change, nocturia, and paresthesia, diabetic polys, visual blurring or hyper / hypo glycemic episodes.  Heme/Lymph: No excessive bleeding, bruising or enlarged lymph nodes.  Physical Exam  BP 166/84   Pulse 92  Temp 98.8 F   Resp 16  Ht 5' 7.75"   Wt 189 lb 5.4 oz     BMI 29.00   General Appearance: Well nourished, in no apparent distress. Eyes: PERRLA, EOMs, conjunctiva no swelling or erythema, normal fundi and vessels. Sinuses: No frontal/maxillary tenderness ENT/Mouth: EACs patent / TMs  nl. Nares clear without erythema, swelling, mucoid exudates. Oral hygiene is good. No erythema, swelling, or exudate. Tongue normal, non-obstructing. Tonsils not swollen or erythematous. Hearing normal.  Neck: Supple, thyroid normal. No bruits, nodes or JVD. Respiratory: Respiratory effort  normal.  BS equal and clear bilateral without rales, rhonci, wheezing or stridor. Cardio: Heart sounds are normal with regular rate and rhythm and no murmurs, rubs or gallops. Peripheral pulses are normal and equal bilaterally without edema. No aortic or femoral bruits. Chest: symmetric with normal excursions and percussion.  Abdomen: Flat, soft, with bowl sounds. Nontender, no guarding, rebound, hernias, masses, or organomegaly.  Lymphatics: Non tender without lymphadenopathy.  Genitourinary: No hernias.Testes nl. DRE - prostate nl for age - smooth & firm w/o nodules. Musculoskeletal: Full ROM all peripheral extremities, joint stability, 5/5 strength, and normal gait. Skin: Warm and dry without rashes, lesions, cyanosis, clubbing or  ecchymosis.  Neuro: Cranial nerves intact, reflexes equal bilaterally. Normal muscle tone, no cerebellar symptoms. Sensation intact. Very slight fine tremor of hands. Pysch: Awake and oriented X 3 with normal affect, insight and judgment appropriate.   Assessment and Plan  1. Essential hypertension  - EKG 12-Lead - [place], RETROPERITNL ABD,  LTD - TSH  2. Hyperlipidemia  - Lipid panel  3. Type 2 diabetes mellitus with other diabetic kidney complication  - Microalbumin / creatinine urine ratio - HM  DIABETES FOOT EXAM - LOW EXTREMITY NEUR EXAM DOCUM - Hemoglobin A1c - Insulin, fasting  4. Vitamin D deficiency  - Vit D  25 hydroxy (rtn osteoporosis monitoring)  5. Testosterone deficiency   6. Hypothyroidism, unspecified hypothyroidism type   7. Screening for rectal cancer  - POC Hemoccult Bld/Stl (3-Cd Home Screen); Future  8. Prostate cancer screening  - PSA  9. Medication management  - Urine Microscopic - CBC with Differential/Platelet - BASIC METABOLIC PANEL WITH GFR - Hepatic function panel - Magnesium  10. Essential tremor  - continue propranolol   11. Other fatigue  - Vitamin B12 - Iron and TIBC  12. Screening examination  for pulmonary tuberculosis  - PPD  13. Need for prophylactic vaccination with tetanus-diphtheria (TD)  - DT Vaccine greater than 7yo IM   Continue prudent diet as discussed, weight control, BP monitoring, regular exercise, and medications as discussed.  Discussed med effects and SE's. Routine screening labs and tests as requested with regular follow-up as recommended.

## 2014-04-02 NOTE — Patient Instructions (Signed)

## 2014-04-03 LAB — CBC WITH DIFFERENTIAL/PLATELET
BASOS ABS: 0 10*3/uL (ref 0.0–0.1)
Basophils Relative: 0 % (ref 0–1)
Eosinophils Absolute: 0.3 10*3/uL (ref 0.0–0.7)
Eosinophils Relative: 3 % (ref 0–5)
HCT: 48.9 % (ref 39.0–52.0)
Hemoglobin: 16.8 g/dL (ref 13.0–17.0)
Lymphocytes Relative: 19 % (ref 12–46)
Lymphs Abs: 2.1 10*3/uL (ref 0.7–4.0)
MCH: 30.2 pg (ref 26.0–34.0)
MCHC: 34.4 g/dL (ref 30.0–36.0)
MCV: 87.9 fL (ref 78.0–100.0)
MPV: 9.7 fL (ref 8.6–12.4)
Monocytes Absolute: 1.5 10*3/uL — ABNORMAL HIGH (ref 0.1–1.0)
Monocytes Relative: 13 % — ABNORMAL HIGH (ref 3–12)
NEUTROS ABS: 7.3 10*3/uL (ref 1.7–7.7)
NEUTROS PCT: 65 % (ref 43–77)
PLATELETS: 303 10*3/uL (ref 150–400)
RBC: 5.56 MIL/uL (ref 4.22–5.81)
RDW: 13.5 % (ref 11.5–15.5)
WBC: 11.3 10*3/uL — AB (ref 4.0–10.5)

## 2014-04-03 LAB — HEPATIC FUNCTION PANEL
ALT: 26 U/L (ref 0–53)
AST: 19 U/L (ref 0–37)
Albumin: 4.2 g/dL (ref 3.5–5.2)
Alkaline Phosphatase: 65 U/L (ref 39–117)
BILIRUBIN DIRECT: 0.1 mg/dL (ref 0.0–0.3)
BILIRUBIN TOTAL: 0.4 mg/dL (ref 0.2–1.2)
Indirect Bilirubin: 0.3 mg/dL (ref 0.2–1.2)
Total Protein: 6.9 g/dL (ref 6.0–8.3)

## 2014-04-03 LAB — INSULIN, FASTING: Insulin fasting, serum: 26.9 u[IU]/mL — ABNORMAL HIGH (ref 2.0–19.6)

## 2014-04-03 LAB — IRON AND TIBC
%SAT: 18 % — ABNORMAL LOW (ref 20–55)
IRON: 59 ug/dL (ref 42–165)
TIBC: 336 ug/dL (ref 215–435)
UIBC: 277 ug/dL (ref 125–400)

## 2014-04-03 LAB — BASIC METABOLIC PANEL WITH GFR
BUN: 24 mg/dL — ABNORMAL HIGH (ref 6–23)
CALCIUM: 10.2 mg/dL (ref 8.4–10.5)
CO2: 26 mEq/L (ref 19–32)
Chloride: 101 mEq/L (ref 96–112)
Creat: 1.22 mg/dL (ref 0.50–1.35)
GFR, EST AFRICAN AMERICAN: 78 mL/min
GFR, Est Non African American: 67 mL/min
Glucose, Bld: 143 mg/dL — ABNORMAL HIGH (ref 70–99)
POTASSIUM: 4.1 meq/L (ref 3.5–5.3)
SODIUM: 138 meq/L (ref 135–145)

## 2014-04-03 LAB — LIPID PANEL
Cholesterol: 192 mg/dL (ref 0–200)
HDL: 19 mg/dL — ABNORMAL LOW (ref >=40)
Total CHOL/HDL Ratio: 10.1 Ratio
Triglycerides: 712 mg/dL — ABNORMAL HIGH (ref <150)

## 2014-04-03 LAB — URINALYSIS, MICROSCOPIC ONLY
Bacteria, UA: NONE SEEN
CASTS: NONE SEEN
Crystals: NONE SEEN
Squamous Epithelial / LPF: NONE SEEN

## 2014-04-03 LAB — VITAMIN D 25 HYDROXY (VIT D DEFICIENCY, FRACTURES): Vit D, 25-Hydroxy: 66 ng/mL (ref 30–100)

## 2014-04-03 LAB — HEMOGLOBIN A1C
Hgb A1c MFr Bld: 7.1 % — ABNORMAL HIGH (ref <5.7)
Mean Plasma Glucose: 157 mg/dL — ABNORMAL HIGH (ref <117)

## 2014-04-03 LAB — MAGNESIUM: MAGNESIUM: 1.5 mg/dL (ref 1.5–2.5)

## 2014-04-03 LAB — PSA: PSA: 1.55 ng/mL (ref <=4.00)

## 2014-04-03 LAB — MICROALBUMIN / CREATININE URINE RATIO
CREATININE, URINE: 117.6 mg/dL
MICROALB UR: 1.8 mg/dL (ref <2.0)
Microalb Creat Ratio: 15.3 mg/g (ref 0.0–30.0)

## 2014-04-03 LAB — VITAMIN B12: Vitamin B-12: 605 pg/mL (ref 211–911)

## 2014-04-03 LAB — TSH: TSH: 2.655 u[IU]/mL (ref 0.350–4.500)

## 2014-05-26 LAB — TB SKIN TEST
Induration: 0 mm
TB Skin Test: NEGATIVE

## 2014-06-09 ENCOUNTER — Other Ambulatory Visit: Payer: Self-pay | Admitting: *Deleted

## 2014-06-09 MED ORDER — TESTOSTERONE CYPIONATE 200 MG/ML IM SOLN: INTRAMUSCULAR | Status: DC

## 2014-06-10 ENCOUNTER — Other Ambulatory Visit: Payer: Self-pay | Admitting: *Deleted

## 2014-06-10 ENCOUNTER — Other Ambulatory Visit: Payer: Self-pay | Admitting: Internal Medicine

## 2014-06-10 ENCOUNTER — Telehealth: Payer: Self-pay | Admitting: *Deleted

## 2014-06-10 MED ORDER — TESTOSTERONE CYPIONATE 200 MG/ML IM SOLN: INTRAMUSCULAR | Status: DC

## 2014-06-10 NOTE — Telephone Encounter (Signed)
Patient called and states he is almost out of Testosterone and needs refill.  He states he is taking 2 ml every 2 weeks.  He is paying $90 for a 10 ml bottle.  He states some med is wasted when he gets th air out of the needle.  Instructed patient on how to not waste the med.  OK to call in refill early per Dr Melford Aase.  Patient aware.

## 2014-07-10 ENCOUNTER — Ambulatory Visit: Payer: Self-pay | Admitting: Internal Medicine

## 2014-08-24 ENCOUNTER — Other Ambulatory Visit: Payer: Self-pay | Admitting: Internal Medicine

## 2014-08-24 DIAGNOSIS — E349 Endocrine disorder, unspecified: Secondary | ICD-10-CM

## 2014-08-24 DIAGNOSIS — G252 Other specified forms of tremor: Secondary | ICD-10-CM

## 2014-08-24 MED ORDER — TESTOSTERONE CYPIONATE 200 MG/ML IM SOLN: INTRAMUSCULAR | Status: DC

## 2014-08-24 MED ORDER — DIAZEPAM 10 MG PO TABS: ORAL_TABLET | ORAL | Status: AC

## 2014-08-25 ENCOUNTER — Telehealth: Payer: Self-pay | Admitting: *Deleted

## 2014-08-25 NOTE — Telephone Encounter (Signed)
Patient advised RX's called in.

## 2014-10-16 ENCOUNTER — Ambulatory Visit: Payer: Self-pay | Admitting: Internal Medicine

## 2015-01-15 ENCOUNTER — Encounter: Payer: Self-pay | Admitting: Internal Medicine

## 2015-03-11 ENCOUNTER — Other Ambulatory Visit: Payer: Self-pay | Admitting: *Deleted

## 2015-03-11 MED ORDER — DIAZEPAM 10 MG PO TABS: ORAL_TABLET | ORAL | Status: AC

## 2015-04-16 ENCOUNTER — Encounter: Payer: Self-pay | Admitting: Internal Medicine

## 2020-03-25 ENCOUNTER — Encounter (HOSPITAL_COMMUNITY): Payer: Self-pay | Admitting: Emergency Medicine

## 2020-03-25 ENCOUNTER — Emergency Department (HOSPITAL_COMMUNITY)
Admission: EM | Admit: 2020-03-25 | Discharge: 2020-03-25 | Disposition: A | Discharge status: Home / Self Care | Payer: BC Managed Care – PPO | Attending: Emergency Medicine | Admitting: Emergency Medicine

## 2020-03-25 DIAGNOSIS — Z87891 Personal history of nicotine dependence: Secondary | ICD-10-CM | POA: Diagnosis not present

## 2020-03-25 DIAGNOSIS — Z79899 Other long term (current) drug therapy: Secondary | ICD-10-CM | POA: Diagnosis not present

## 2020-03-25 DIAGNOSIS — E875 Hyperkalemia: Secondary | ICD-10-CM | POA: Insufficient documentation

## 2020-03-25 DIAGNOSIS — I129 Hypertensive chronic kidney disease with stage 1 through stage 4 chronic kidney disease, or unspecified chronic kidney disease: Secondary | ICD-10-CM | POA: Insufficient documentation

## 2020-03-25 DIAGNOSIS — E1165 Type 2 diabetes mellitus with hyperglycemia: Secondary | ICD-10-CM | POA: Diagnosis not present

## 2020-03-25 DIAGNOSIS — Z7984 Long term (current) use of oral hypoglycemic drugs: Secondary | ICD-10-CM | POA: Diagnosis not present

## 2020-03-25 DIAGNOSIS — R739 Hyperglycemia, unspecified: Secondary | ICD-10-CM

## 2020-03-25 DIAGNOSIS — E1122 Type 2 diabetes mellitus with diabetic chronic kidney disease: Secondary | ICD-10-CM | POA: Diagnosis not present

## 2020-03-25 DIAGNOSIS — N189 Chronic kidney disease, unspecified: Secondary | ICD-10-CM

## 2020-03-25 DIAGNOSIS — N182 Chronic kidney disease, stage 2 (mild): Secondary | ICD-10-CM | POA: Diagnosis not present

## 2020-03-25 DIAGNOSIS — E039 Hypothyroidism, unspecified: Secondary | ICD-10-CM | POA: Diagnosis not present

## 2020-03-25 LAB — COMPREHENSIVE METABOLIC PANEL
ALT: 21 U/L (ref 0–44)
AST: 20 U/L (ref 15–41)
Albumin: 4 g/dL (ref 3.5–5.0)
Alkaline Phosphatase: 74 U/L (ref 38–126)
Anion gap: 13 (ref 5–15)
BUN: 37 mg/dL — ABNORMAL HIGH (ref 6–20)
CO2: 21 mmol/L — ABNORMAL LOW (ref 22–32)
Calcium: 9.8 mg/dL (ref 8.9–10.3)
Chloride: 101 mmol/L (ref 98–111)
Creatinine, Ser: 2.26 mg/dL — ABNORMAL HIGH (ref 0.61–1.24)
GFR, Estimated: 33 mL/min — ABNORMAL LOW (ref >60)
Glucose, Bld: 287 mg/dL — ABNORMAL HIGH (ref 70–99)
Potassium: 5.2 mmol/L — ABNORMAL HIGH (ref 3.5–5.1)
Sodium: 135 mmol/L (ref 135–145)
Total Bilirubin: 0.7 mg/dL (ref 0.3–1.2)
Total Protein: 7.1 g/dL (ref 6.5–8.1)

## 2020-03-25 LAB — I-STAT CHEM 8, ED
BUN: 44 mg/dL — ABNORMAL HIGH (ref 6–20)
Calcium, Ion: 1.29 mmol/L (ref 1.15–1.40)
Chloride: 101 mmol/L (ref 98–111)
Creatinine, Ser: 2.3 mg/dL — ABNORMAL HIGH (ref 0.61–1.24)
Glucose, Bld: 274 mg/dL — ABNORMAL HIGH (ref 70–99)
HCT: 44 % (ref 39.0–52.0)
Hemoglobin: 15 g/dL (ref 13.0–17.0)
Potassium: 5.4 mmol/L — ABNORMAL HIGH (ref 3.5–5.1)
Sodium: 136 mmol/L (ref 135–145)
TCO2: 23 mmol/L (ref 22–32)

## 2020-03-25 LAB — URINALYSIS, ROUTINE W REFLEX MICROSCOPIC
Bacteria, UA: NONE SEEN
Bilirubin Urine: NEGATIVE
Glucose, UA: >=500 mg/dL — AB
Hgb urine dipstick: NEGATIVE
Ketones, ur: NEGATIVE mg/dL
Leukocytes,Ua: NEGATIVE
Nitrite: NEGATIVE
Protein, ur: NEGATIVE mg/dL
Specific Gravity, Urine: 1.008 (ref 1.005–1.030)
pH: 5 (ref 5.0–8.0)

## 2020-03-25 LAB — CBC
HCT: 43.4 % (ref 39.0–52.0)
Hemoglobin: 14.7 g/dL (ref 13.0–17.0)
MCH: 30.2 pg (ref 26.0–34.0)
MCHC: 33.9 g/dL (ref 30.0–36.0)
MCV: 89.1 fL (ref 80.0–100.0)
Platelets: 291 10*3/uL (ref 150–400)
RBC: 4.87 MIL/uL (ref 4.22–5.81)
RDW: 12.6 % (ref 11.5–15.5)
WBC: 9 10*3/uL (ref 4.0–10.5)
nRBC: 0 % (ref 0.0–0.2)

## 2020-03-25 MED ORDER — SODIUM ZIRCONIUM CYCLOSILICATE 10 G PO PACK
10.0000 g | PACK | Freq: Once | ORAL | Status: AC
  Administered 2020-03-25: 10 g via ORAL

## 2020-03-25 NOTE — ED Provider Notes (Signed)
[facility] EMERGENCY DEPARTMENT Provider Note   CSN: [id number] Arrival date & time: 03/25/20  4562     History No chief complaint on file.   Hunter Fields is a 60 y.o. male.  Patient is sent to [place] with a laboratory abnormality from his primary care provider.  He had routine labs drawn yesterday to screen for complications of his diabetes and hypertension.  Those showed abnormal potassium at 6.6 and he was instructed to come to [place].  He has had no chest pain shortness of breath headaches blurry vision, has had no strokelike symptoms had no nausea vomiting he has been having normal urination normal bowel movements his diet is poor but recently he is trying to make changes to eat more balanced and frequent meals per day.        Past Medical History:  Diagnosis Date  . Allergy   . Anxiety   . Diabetes mellitus   . Hyperlipidemia   . Hypertension   . Hypothyroid   . Testosterone deficiency     Patient Active Problem List   Diagnosis Date Noted  . Essential tremor 04/02/2014  . Medication management 05/18/2013  . Vitamin D deficiency 05/18/2013  . 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 Hyperlipidemia   . Hypertension   . Hypothyroid   . Anxiety 09/23/2010  . Testosterone deficiency     Past Surgical History:  Procedure Laterality Date  . APPENDECTOMY  2006  . COLON SURGERY  2006   large diverticulum resected  . COLONOSCOPY  09/23/2010   diverticulosis, lymphoid rectal polyp       History reviewed. No pertinent family history.  Social History   Tobacco Use  . Smoking status: Former Smoker    Quit date: 01/31/1995    Years since quitting: 25.1  . Smokeless tobacco: Never Used  Substance Use Topics  . Alcohol use: Yes    Comment: OCC WINE OR BEER,MAYBE MONTHLY  . Drug use: No    Home Medications Prior to Admission medications   Medication Sig Start Date End Date Taking? Authorizing Provider  b complex vitamins tablet  Take 1 tablet by mouth daily.      [provider]  Cholecalciferol (VITAMIN D) 2000 UNITS tablet Take 2,000 Units by mouth daily.      [provider]  clonazePAM [facility]) 2 MG tablet  01/21/14   [provider]  Cyanocobalamin (VITAMIN B 12 PO) Take 1,000 mcg by mouth daily.      [provider]  diazepam (VALIUM) 10 MG tablet Take 1/2 to 1 tablet by mouth 4 times daily for tremor. 03/11/15   Unk Pinto, MD  fenofibrate micronized (LOFIBRA) 134 MG capsule TAKE 1 CAPSULE DAILY FOR BLOOD FATS 02/10/14   Unk Pinto, MD  Flaxseed, Linseed, (FLAXSEED OIL PO) Take 1,200 mg by mouth. Takes 1 capsule daily    [provider]  glyBURIDE (DIABETA) 5 MG tablet Takes 1/2 to 1 tab po BID-TID daily for diabetes 10/20/13   Unk Pinto, MD  HYDROcodone-acetaminophen [facility]) 5-325 MG per tablet Take 1/2 to 1 tablet every 3 to 4 hours is needed for cough or pain 02/04/14   Unk Pinto, MD  levothyroxine (SYNTHROID) 75 MCG tablet Take 1 tablet (75 mcg total) by mouth daily. 02/10/14 02/10/15  Unk Pinto, MD  lisinopril (PRINIVIL,ZESTRIL) 40 MG tablet Take 1 tablet (40 mg total) by mouth daily. 02/10/14   Unk Pinto, MD  losartan-hydrochlorothiazide [facility]) 100-25  MG per tablet Take 1 tablet by mouth daily. 02/11/14 07/12/15  Unk Pinto, MD  metFORMIN (GLUCOPHAGE-XR) 500 MG 24 hr tablet TAKE 2 TABLETS TWICE A DAY FOR DIABETES MELLITUS 02/10/14   Unk Pinto, MD  Multiple Vitamin (MULTIVITAMIN) tablet Take 1 tablet by mouth daily.      [provider]  Omega-3 Fatty Acids (FISH OIL PO) Take 1,400 mg by mouth daily.      [provider]  propranolol (INDERAL) 80 MG tablet Take 80 mg by mouth daily.    [provider]  testosterone cypionate (DEPOTESTOSTERONE CYPIONATE) 200 MG/ML injection inject 2 ml's intramuscularly every 2 weeks as directed by physician & 43ml syringes and 1" 21 G needles 08/24/14 02/24/15   Unk Pinto, MD    Allergies    Food  Review of Systems   Review of Systems  Constitutional: Negative for chills and fever.  HENT: Negative for congestion and rhinorrhea.   Respiratory: Negative for cough and shortness of breath.   Cardiovascular: Negative for chest pain and palpitations.  Gastrointestinal: Negative for diarrhea, nausea and vomiting.  Genitourinary: Negative for difficulty urinating and dysuria.  Musculoskeletal: Negative for arthralgias and back pain.  Skin: Negative for color change and rash.  Neurological: Negative for light-headedness and headaches.    Physical Exam Updated Vital Signs BP 117/81   Pulse 88   Temp (!) 97.4 F (36.3 C) (Oral)   Resp 18   SpO2 99%   Physical Exam Vitals and nursing note reviewed.  Constitutional:      General: He is not in acute distress.    Appearance: Normal appearance.  HENT:     Head: Normocephalic and atraumatic.     Nose: No rhinorrhea.  Eyes:     General:        Right eye: No discharge.        Left eye: No discharge.     Conjunctiva/sclera: Conjunctivae normal.  Cardiovascular:     Rate and Rhythm: Normal rate and regular rhythm.     Heart sounds: No murmur heard.   Pulmonary:     Effort: Pulmonary effort is normal.     Breath sounds: No stridor.  Abdominal:     General: Abdomen is flat. There is no distension.     Palpations: Abdomen is soft.  Musculoskeletal:        General: No deformity or signs of injury.  Skin:    General: Skin is warm and dry.  Neurological:     General: No focal deficit present.     Mental Status: He is alert. Mental status is at baseline.     Motor: No weakness.  Psychiatric:        Mood and Affect: Mood normal.        Behavior: Behavior normal.        Thought Content: Thought content normal.     ED Results / Procedures / Treatments   Labs (all labs ordered are listed, but only abnormal results are displayed) Labs Reviewed  COMPREHENSIVE METABOLIC PANEL -  Abnormal; Notable for the following components:      Result Value   Potassium 5.2 (*)    CO2 21 (*)    Glucose, Bld 287 (*)    BUN 37 (*)    Creatinine, Ser 2.26 (*)    GFR, Estimated 33 (*)    All other components within normal limits  URINALYSIS, ROUTINE W REFLEX MICROSCOPIC - Abnormal; Notable for the following components:   Glucose,  UA >=500 (*)    All other components within normal limits  I-STAT CHEM 8, ED - Abnormal; Notable for the following components:   Potassium 5.4 (*)    BUN 44 (*)    Creatinine, Ser 2.30 (*)    Glucose, Bld 274 (*)    All other components within normal limits  CBC    EKG EKG Interpretation  Date/Time:  Thursday March 25 2020 08:12:21 EST Ventricular Rate:  102 PR Interval:  120 QRS Duration: 98 QT Interval:  322 QTC Calculation: 419 R Axis:   51 Text Interpretation: Sinus tachycardia Otherwise normal ECG Confirmed by Dewaine Conger [id number]) on 03/25/2020 8:16:57 AM   Radiology No results found.  Procedures Procedures   Medications Ordered in ED Medications  sodium zirconium cyclosilicate (LOKELMA) packet 10 g (10 g Oral Given 03/25/20 1009)    ED Course  I have reviewed the triage vital signs and the nursing notes.  Pertinent labs & imaging results that were available during my care of the patient were reviewed by me and considered in my medical decision making (see chart for details).    MDM Rules/Calculators/A&P                          Patient here with history of chronic kidney disease hypertension and diabetes, poorly controlled.  Normal vital signs today.  No complaints of symptoms.  Here because he was instructed to come to the laboratory abnormalities.  EKG shows sinus rhythm there may be some mild peaked T waves in precordial leads, but not a significant finding otherwise.  We will monitor this and repeat.  I-STAT chemistry was performed by nursing triage and showed a potassium of 5.4 and creatinine of 2.4.  Review of old  records from outside facility does show elevated creatinine but typically as high as 1.8-1.9.  Given his most recent hemoglobin A1c drawn yesterday at 12, it is likely he is very noncompliant with dietary changes needed to control his diabetes.  And he may have worsening kidney dysfunction secondary to this.  Patient is overall tolerating p.o. and well-appearing.  Laboratory analysis today shows potassium of 5.2 and creatinine of 2.24.  With these derangements I did consult with nephrology to get an appropriate outpatient plan.  They agreed this patient is safe for outpatient work-up.  1 dose of Lokelma 10 g today.  Then outpatient follow-up for repeat testing in 1 week with return precautions and low potassium diet recommendations.  There have this is patient is an outpatient.  They agree this is likely chronic slow change over the last year and does not warrant emergent admission for further evaluation.  They would recommend holding lisinopril as this can cause elevations in potassium.  Patient understands this.  We will wait for the urinalysis for final disposition.  Urinalysis shows elevated glucose in the urine however no other abnormalities. Patient safe for discharge home. Strict return precautions given. Outpatient follow-up recommended. Final Clinical Impression(s) / ED Diagnoses Final diagnoses:  Hyperkalemia  Chronic kidney disease, unspecified CKD stage  Hyperglycemia    Rx / DC Orders ED Discharge Orders    None       Breck Coons, MD 03/25/20 1029

## 2020-03-25 NOTE — ED Notes (Signed)
Pt d/c home per MD order Discharge summary reviewed with pt. Pt verbalizes understanding. No s/s of acute distress noted at discharge, voicing no complaints, ambulatory off unit. discharged home with significant other.

## 2020-03-25 NOTE — Discharge Instructions (Addendum)
Stop taking your lisinopril and follow-up with your primary care provider in the next week for repeat testing and evaluation of blood pressure.  Return to [place] with any concerning symptoms.

## 2020-03-25 NOTE — ED Triage Notes (Signed)
Pt here from home sent over of a K of 6.6 , pt has no complaints

## 2020-03-25 NOTE — ED Notes (Signed)
specimen cup provided. Pt encouraged to provide urine per MD order.

## 2020-05-13 ENCOUNTER — Encounter: Payer: Self-pay | Admitting: Endocrinology

## 2020-12-05 ENCOUNTER — Encounter: Payer: Self-pay | Admitting: Internal Medicine

## 2020-12-26 LAB — COLOGUARD: COLOGUARD: NEGATIVE

## 2021-05-29 ENCOUNTER — Other Ambulatory Visit: Payer: Self-pay

## 2021-05-29 ENCOUNTER — Observation Stay (HOSPITAL_COMMUNITY): Payer: Self-pay

## 2021-05-29 ENCOUNTER — Emergency Department (HOSPITAL_COMMUNITY): Payer: Self-pay

## 2021-05-29 ENCOUNTER — Encounter (HOSPITAL_COMMUNITY): Payer: Self-pay

## 2021-05-29 ENCOUNTER — Inpatient Hospital Stay (HOSPITAL_COMMUNITY)
Admission: EM | Admit: 2021-05-29 | Discharge: 2021-06-01 | DRG: 035 | Disposition: A | Discharge status: Home / Self Care | Payer: Self-pay | Attending: Internal Medicine | Admitting: Internal Medicine

## 2021-05-29 DIAGNOSIS — R202 Paresthesia of skin: Secondary | ICD-10-CM | POA: Diagnosis present

## 2021-05-29 DIAGNOSIS — Z8673 Personal history of transient ischemic attack (TIA), and cerebral infarction without residual deficits: Secondary | ICD-10-CM

## 2021-05-29 DIAGNOSIS — E871 Hypo-osmolality and hyponatremia: Secondary | ICD-10-CM | POA: Diagnosis not present

## 2021-05-29 DIAGNOSIS — E781 Pure hyperglyceridemia: Secondary | ICD-10-CM | POA: Diagnosis present

## 2021-05-29 DIAGNOSIS — G514 Facial myokymia: Secondary | ICD-10-CM | POA: Diagnosis present

## 2021-05-29 DIAGNOSIS — Z833 Family history of diabetes mellitus: Secondary | ICD-10-CM

## 2021-05-29 DIAGNOSIS — R569 Unspecified convulsions: Secondary | ICD-10-CM | POA: Diagnosis present

## 2021-05-29 DIAGNOSIS — Z79899 Other long term (current) drug therapy: Secondary | ICD-10-CM

## 2021-05-29 DIAGNOSIS — R739 Hyperglycemia, unspecified: Secondary | ICD-10-CM

## 2021-05-29 DIAGNOSIS — I639 Cerebral infarction, unspecified: Secondary | ICD-10-CM | POA: Diagnosis present

## 2021-05-29 DIAGNOSIS — I63232 Cerebral infarction due to unspecified occlusion or stenosis of left carotid arteries: Principal | ICD-10-CM | POA: Diagnosis present

## 2021-05-29 DIAGNOSIS — N182 Chronic kidney disease, stage 2 (mild): Secondary | ICD-10-CM | POA: Diagnosis present

## 2021-05-29 DIAGNOSIS — E1129 Type 2 diabetes mellitus with other diabetic kidney complication: Secondary | ICD-10-CM

## 2021-05-29 DIAGNOSIS — R29700 NIHSS score 0: Secondary | ICD-10-CM | POA: Diagnosis present

## 2021-05-29 DIAGNOSIS — I129 Hypertensive chronic kidney disease with stage 1 through stage 4 chronic kidney disease, or unspecified chronic kidney disease: Secondary | ICD-10-CM | POA: Diagnosis present

## 2021-05-29 DIAGNOSIS — E861 Hypovolemia: Secondary | ICD-10-CM | POA: Diagnosis present

## 2021-05-29 DIAGNOSIS — E785 Hyperlipidemia, unspecified: Secondary | ICD-10-CM | POA: Diagnosis present

## 2021-05-29 DIAGNOSIS — Z8249 Family history of ischemic heart disease and other diseases of the circulatory system: Secondary | ICD-10-CM

## 2021-05-29 DIAGNOSIS — E1165 Type 2 diabetes mellitus with hyperglycemia: Secondary | ICD-10-CM | POA: Diagnosis present

## 2021-05-29 DIAGNOSIS — R200 Anesthesia of skin: Secondary | ICD-10-CM | POA: Diagnosis present

## 2021-05-29 DIAGNOSIS — E039 Hypothyroidism, unspecified: Secondary | ICD-10-CM | POA: Diagnosis present

## 2021-05-29 DIAGNOSIS — E1122 Type 2 diabetes mellitus with diabetic chronic kidney disease: Secondary | ICD-10-CM | POA: Diagnosis present

## 2021-05-29 DIAGNOSIS — F17210 Nicotine dependence, cigarettes, uncomplicated: Secondary | ICD-10-CM | POA: Diagnosis present

## 2021-05-29 DIAGNOSIS — Z91018 Allergy to other foods: Secondary | ICD-10-CM

## 2021-05-29 DIAGNOSIS — I6340 Cerebral infarction due to embolism of unspecified cerebral artery: Secondary | ICD-10-CM | POA: Diagnosis present

## 2021-05-29 DIAGNOSIS — Z7982 Long term (current) use of aspirin: Secondary | ICD-10-CM

## 2021-05-29 DIAGNOSIS — I10 Essential (primary) hypertension: Secondary | ICD-10-CM | POA: Diagnosis present

## 2021-05-29 DIAGNOSIS — I6310 Cerebral infarction due to embolism of unspecified precerebral artery: Secondary | ICD-10-CM

## 2021-05-29 DIAGNOSIS — E119 Type 2 diabetes mellitus without complications: Secondary | ICD-10-CM

## 2021-05-29 LAB — BASIC METABOLIC PANEL
Anion gap: 8 (ref 5–15)
BUN: 33 mg/dL — ABNORMAL HIGH (ref 8–23)
CO2: 20 mmol/L — ABNORMAL LOW (ref 22–32)
Calcium: 8.9 mg/dL (ref 8.9–10.3)
Chloride: 102 mmol/L (ref 98–111)
Creatinine, Ser: 1.48 mg/dL — ABNORMAL HIGH (ref 0.61–1.24)
GFR, Estimated: 53 mL/min — ABNORMAL LOW (ref >60)
Glucose, Bld: 392 mg/dL — ABNORMAL HIGH (ref 70–99)
Potassium: 4.4 mmol/L (ref 3.5–5.1)
Sodium: 130 mmol/L — ABNORMAL LOW (ref 135–145)

## 2021-05-29 LAB — CBC WITH DIFFERENTIAL/PLATELET
Abs Immature Granulocytes: 0.02 10*3/uL (ref 0.00–0.07)
Basophils Absolute: 0 10*3/uL (ref 0.0–0.1)
Basophils Relative: 0 %
Eosinophils Absolute: 0.1 10*3/uL (ref 0.0–0.5)
Eosinophils Relative: 1 %
HCT: 40.5 % (ref 39.0–52.0)
Hemoglobin: 13.5 g/dL (ref 13.0–17.0)
Immature Granulocytes: 0 %
Lymphocytes Relative: 16 %
Lymphs Abs: 1 10*3/uL (ref 0.7–4.0)
MCH: 29.2 pg (ref 26.0–34.0)
MCHC: 33.3 g/dL (ref 30.0–36.0)
MCV: 87.5 fL (ref 80.0–100.0)
Monocytes Absolute: 0.5 10*3/uL (ref 0.1–1.0)
Monocytes Relative: 8 %
Neutro Abs: 4.8 10*3/uL (ref 1.7–7.7)
Neutrophils Relative %: 75 %
Platelets: 278 10*3/uL (ref 150–400)
RBC: 4.63 MIL/uL (ref 4.22–5.81)
RDW: 12.5 % (ref 11.5–15.5)
WBC: 6.5 10*3/uL (ref 4.0–10.5)
nRBC: 0 % (ref 0.0–0.2)

## 2021-05-29 LAB — GLUCOSE, CAPILLARY: Glucose-Capillary: 241 mg/dL — ABNORMAL HIGH (ref 70–99)

## 2021-05-29 LAB — CBG MONITORING, ED
Glucose-Capillary: 430 mg/dL — ABNORMAL HIGH (ref 70–99)
Glucose-Capillary: 396 mg/dL — ABNORMAL HIGH (ref 70–99)
Glucose-Capillary: 257 mg/dL — ABNORMAL HIGH (ref 70–99)

## 2021-05-29 IMAGING — DX DG CHEST 2V
2 series · 2 of 2 positions shown · non-contrast
Comparison: Remote exam [DATE]

CLINICAL DATA: Seizure.

EXAM:
CHEST - 2 VIEW

[chest pa]
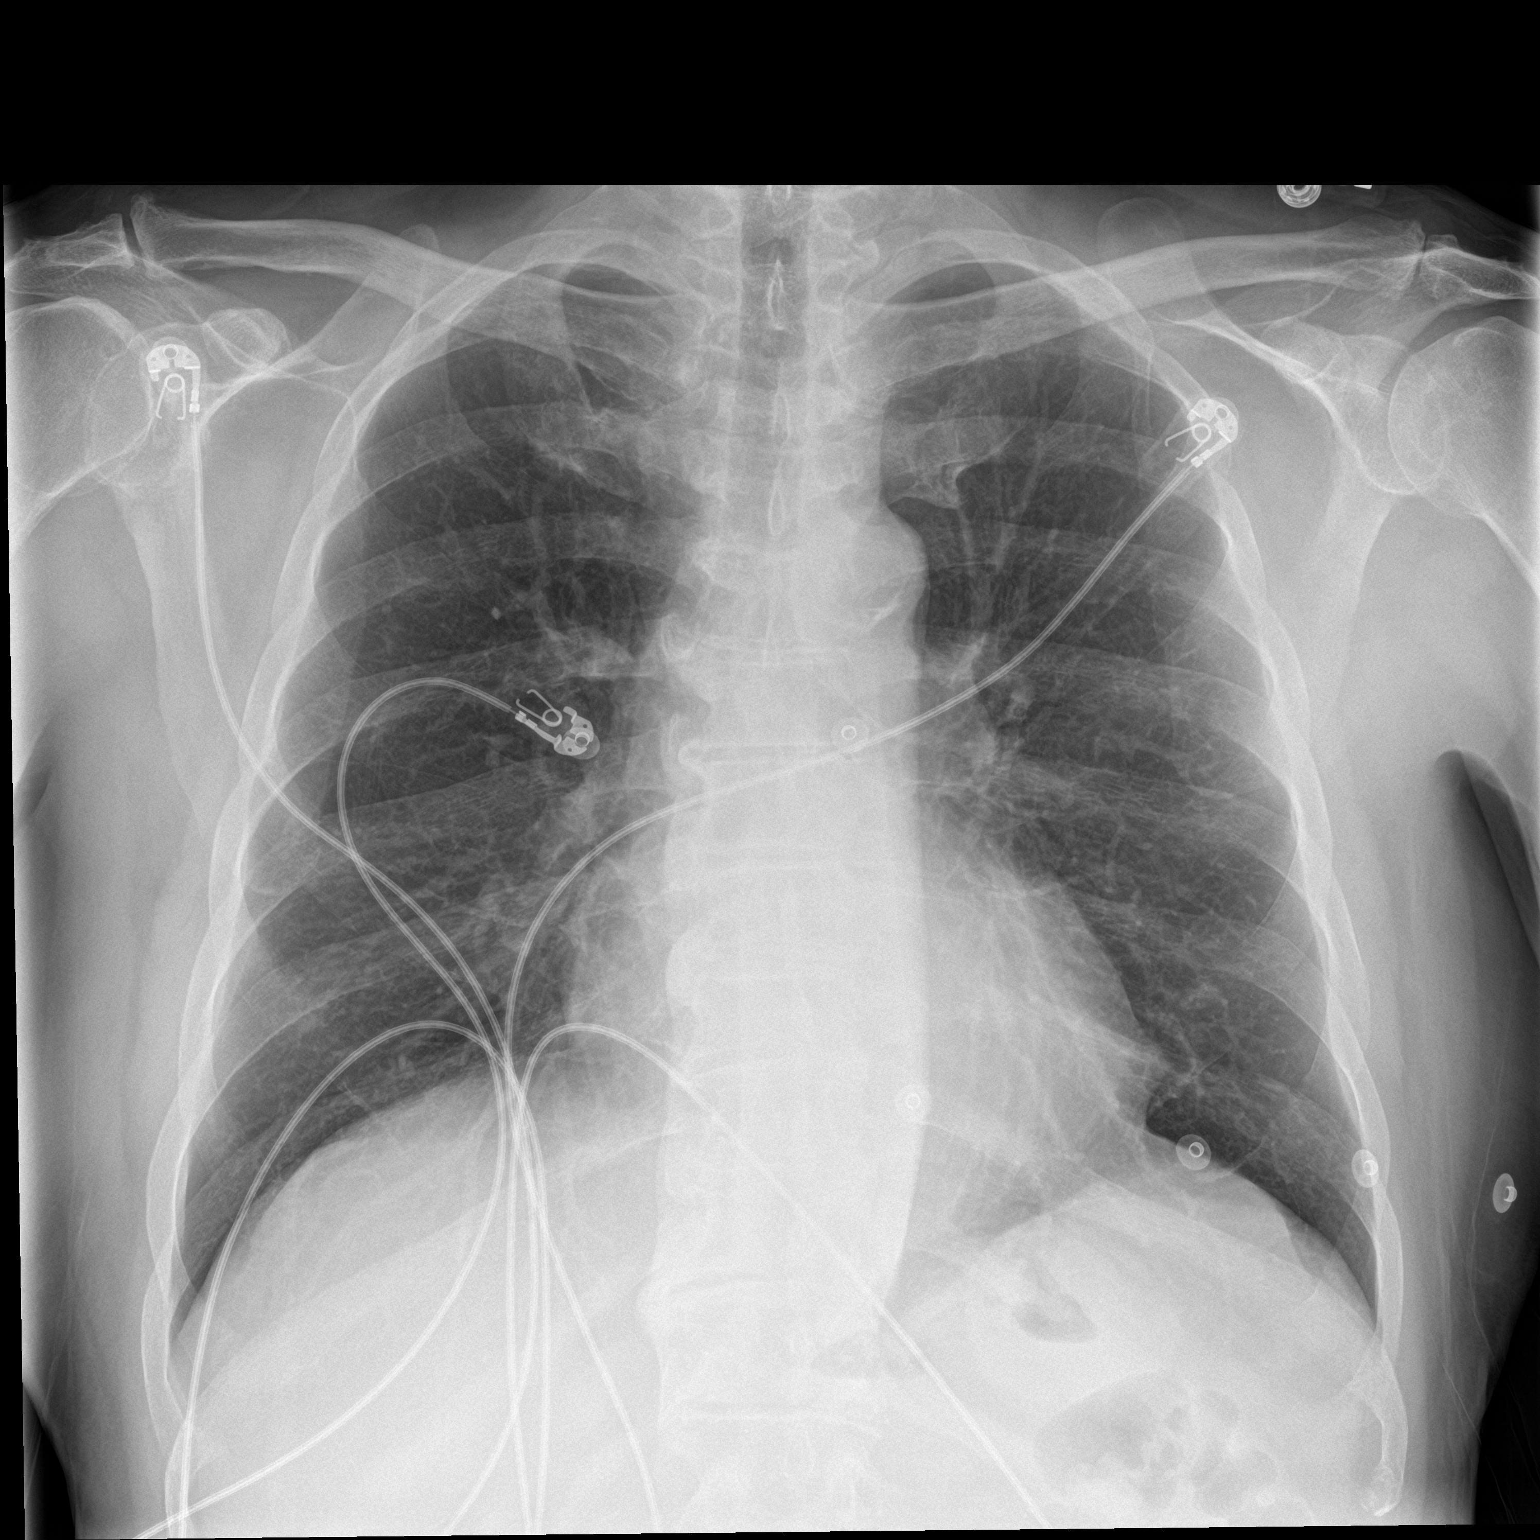

[chest lat]
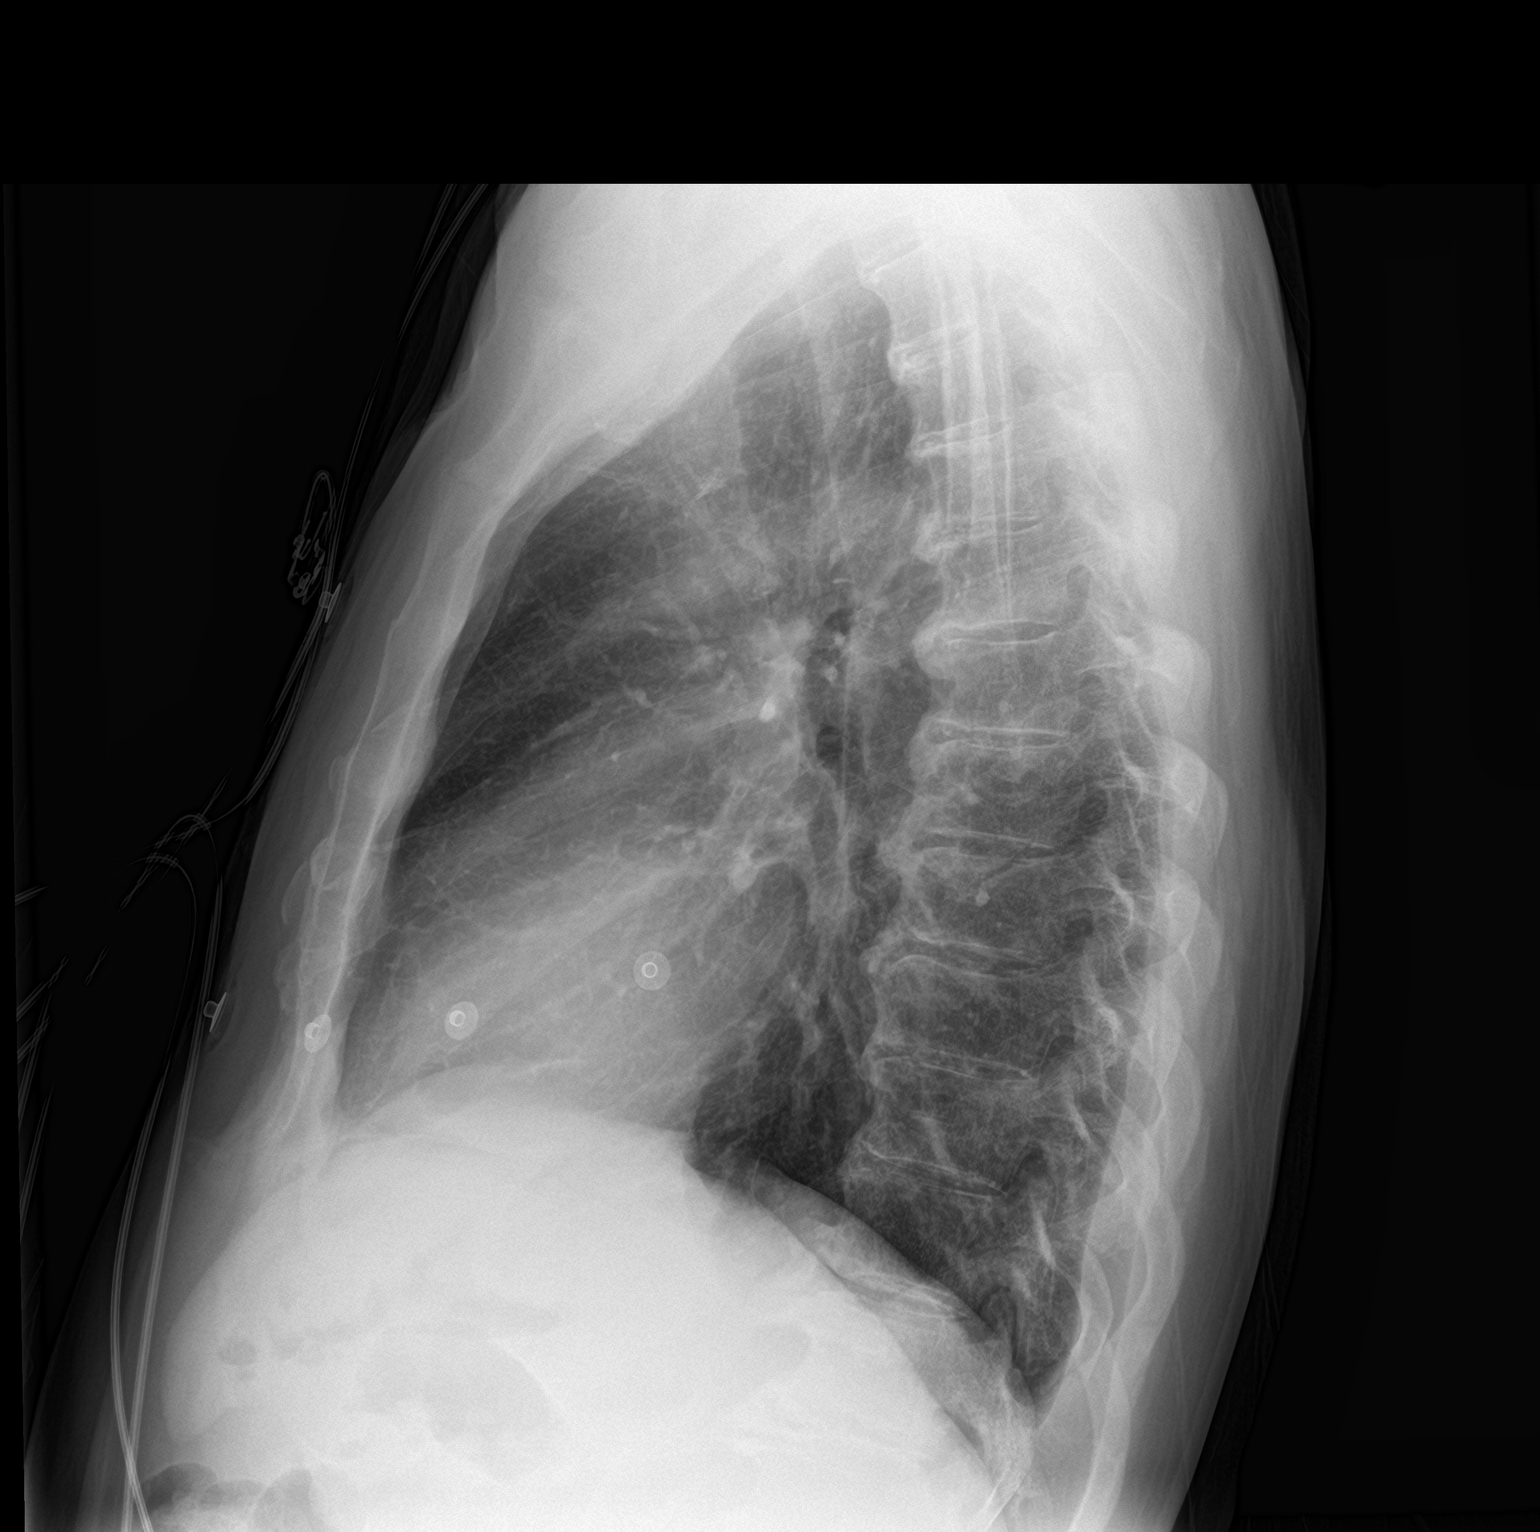

[2 of 2 positions shown; findings below may reference images not displayed]

FINDINGS: Upper normal heart size.The cardiomediastinal contours are normal.
Aortic atherosclerosis. Pulmonary vasculature is normal. No
consolidation, pleural effusion, or pneumothorax. Thoracic
spondylosis. No acute osseous abnormalities are seen. No planted
medical device or metallic foreign body. There are surgical clips at
the thoracic inlet.
IMPRESSION: No acute chest findings.

## 2021-05-29 IMAGING — MR MR HEAD WO/W CM
7 of 14 series · 14 of 48 positions shown · IV contrast (gadavist)
Comparison: None.

CLINICAL DATA: Seizure right facial twitching

EXAM:
MRI HEAD WITHOUT AND WITH CONTRAST
TECHNIQUE: Multiplanar, multiecho pulse sequences of the brain and surrounding
structures were obtained without and with intravenous contrast.
CONTRAST:  8.5mL GADAVIST GADOBUTROL 1 MMOL/ML IV SOLN

[Series 2: DWI · axial · 3.0mm · 0.94mm/px · z∈[-50,+109]mm · 6 of 108 slices shown]
[im 1/108]
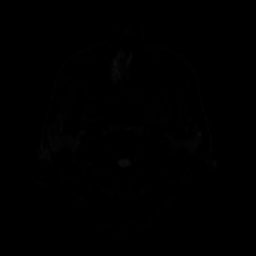
[im 22/108]
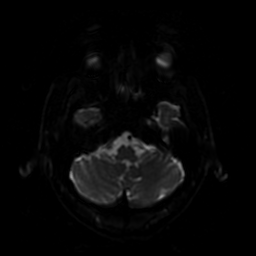
[im 43/108]
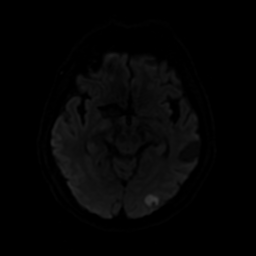
[im 65/108]
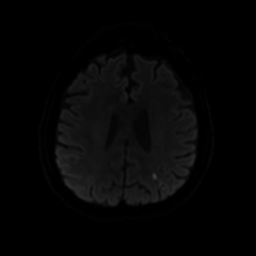
[im 86/108]
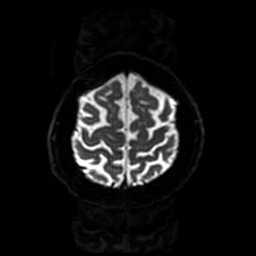
[im 108/108]
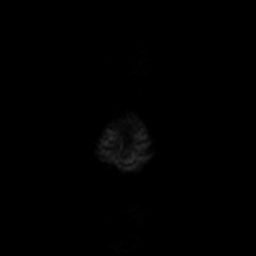

[Series 3: FLAIR · sagittal · 5.0mm · 0.23mm/px · 2 of 27 slices shown (1 of 4)]
[im 1/27]
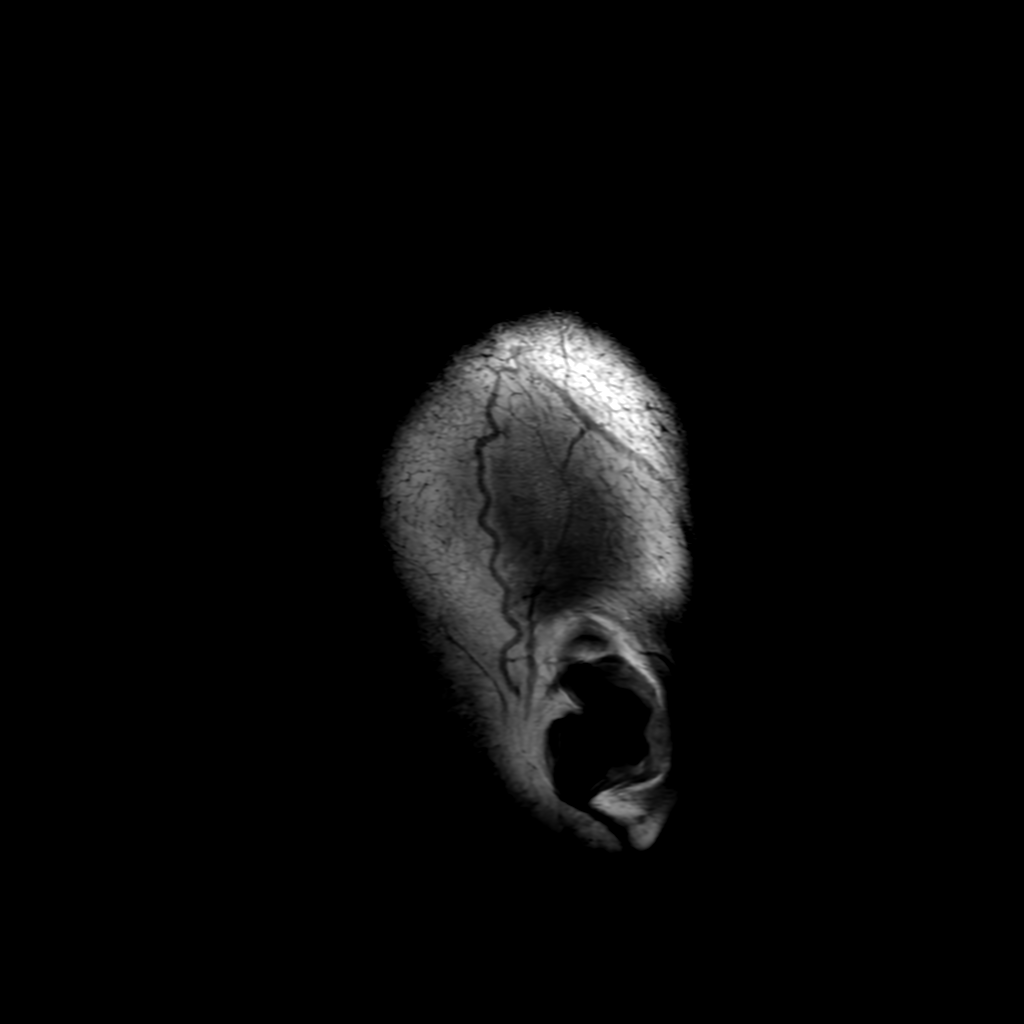
[im 27/27]
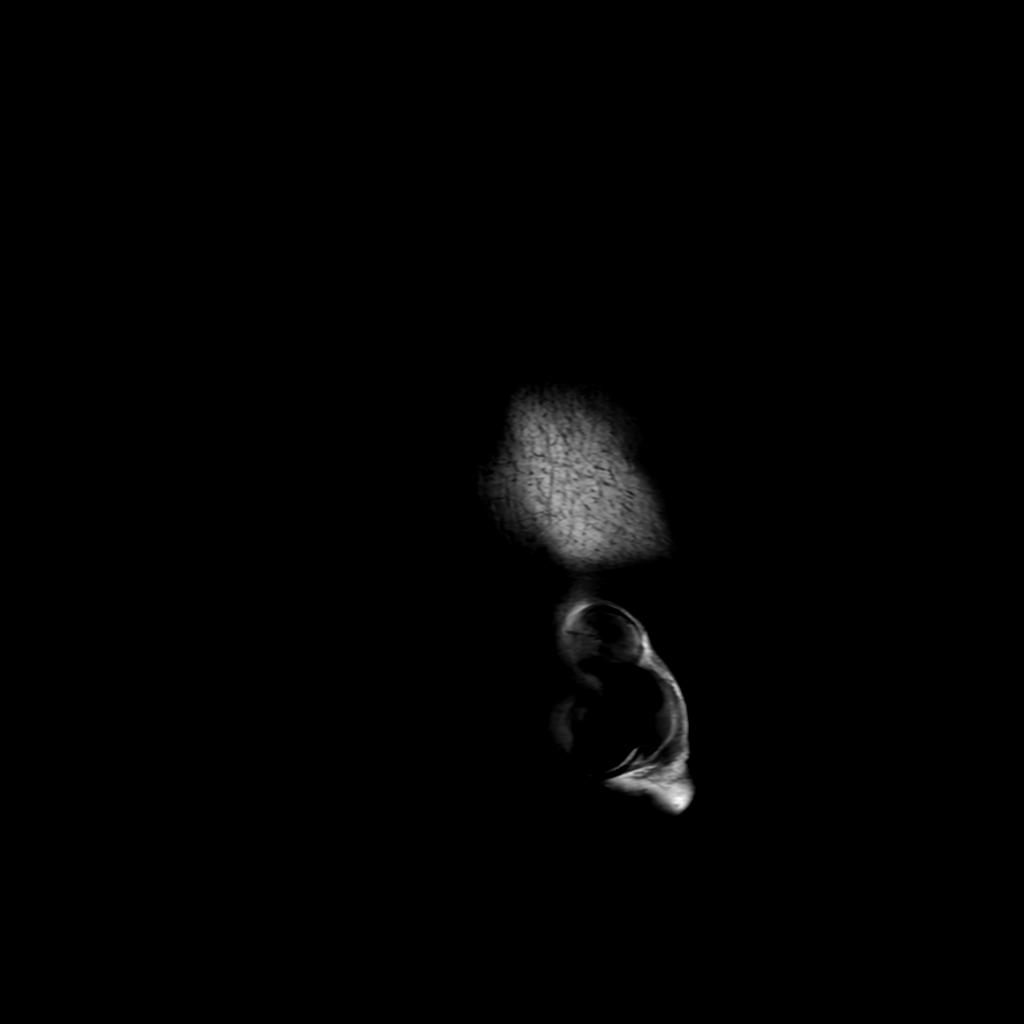

[Series 5: FLAIR · sagittal · 5.0mm · 0.23mm/px · 1 of 27 slices shown (2 of 4)]
[im 1/27]
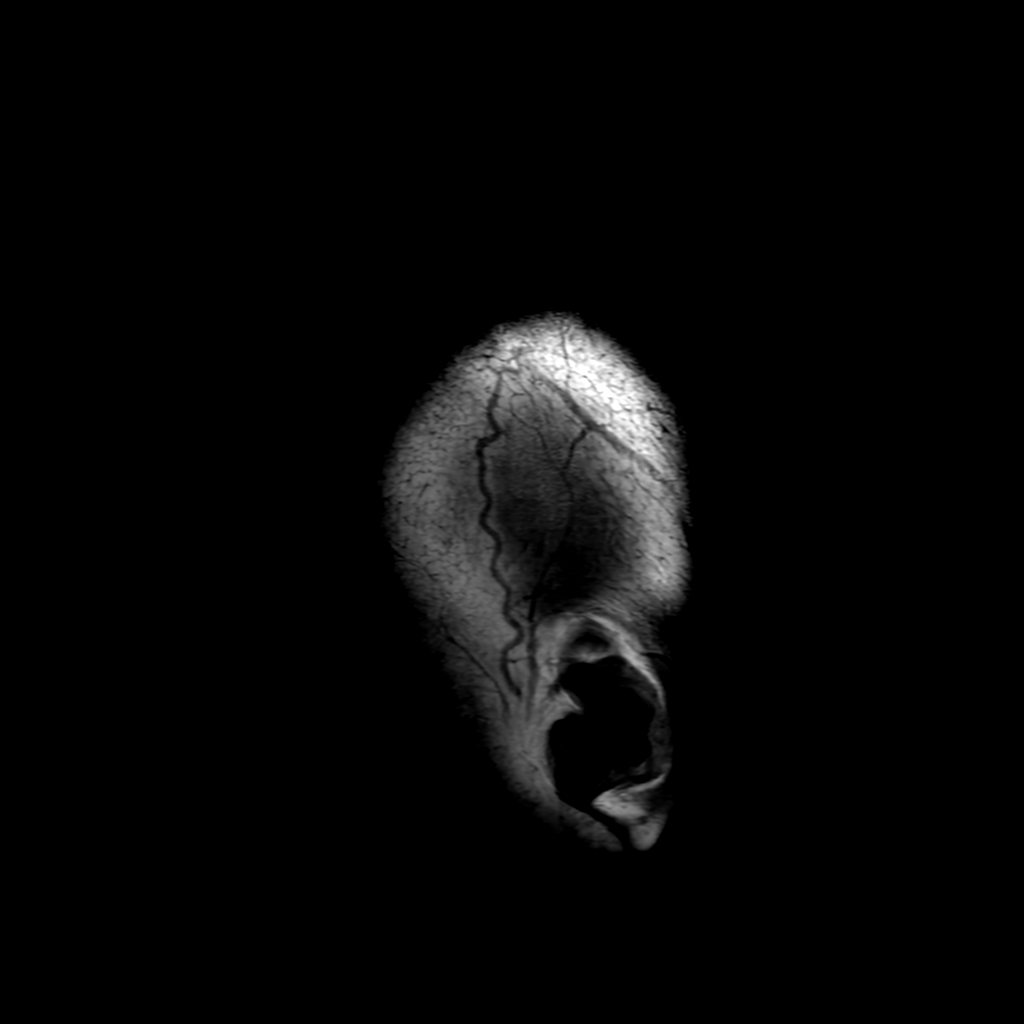

[Series 6: FLAIR · axial · 3.0mm · 0.41mm/px · 1 of 27 slices shown (3 of 4)]
[im 1/27]
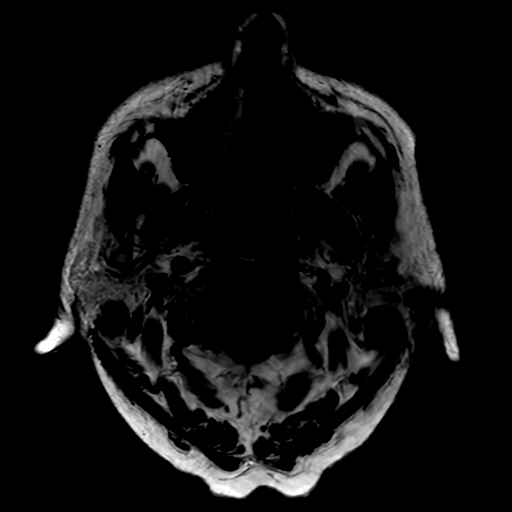

[Series 10: FLAIR · coronal · 3.0mm · 0.39mm/px · 1 of 32 slices shown (4 of 4)]
[im 1/32]
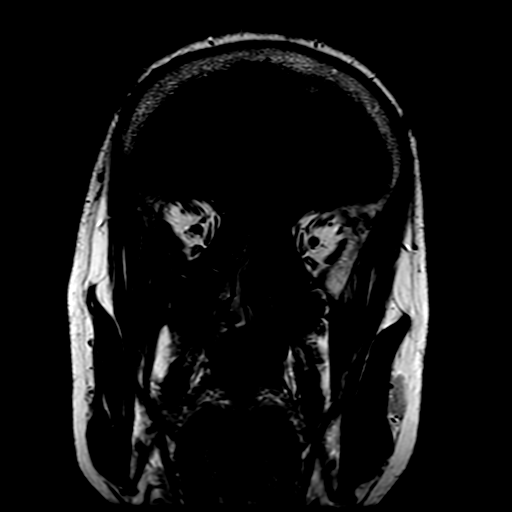

[Series 13: T1 post-contrast · coronal · 5.0mm · 0.47mm/px · 1 of 31 slices shown]
[im 1/31]
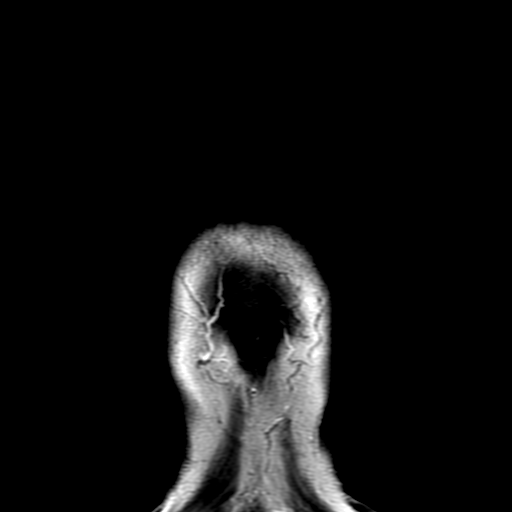

[Series 250: ADC · axial · 3.0mm · 0.94mm/px · z∈[-50,+109]mm · 2 of 54 slices shown]
[im 1/54]
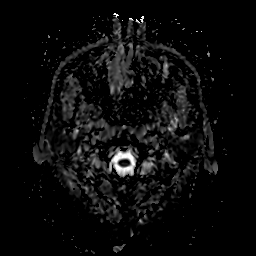
[im 54/54]
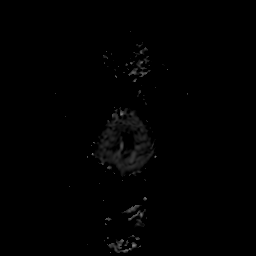

[14 of 48 positions shown; findings below may reference images not displayed]

FINDINGS: Brain: Restricted diffusion in the left cerebral hemisphere, most
prominently in the left occipital cortex (series 2, images 21,
24-27) And left frontal cortex (series 2, images 37-43), with
additional punctate cortical foci in the left parietal lobe (series
2, image 40 and 35) and additional scattered white matter foci
(series 2, image 32-36). Some enhancement is noted in the left
frontal cortex (series 12, image 115-119), which correlates with the
area of restricted diffusion in the left frontal lobe, suggesting
that these infarcts are in part subacute.

No acute hemorrhage, mass, mass effect, or midline shift. No
hydrocephalus or extra-axial collection. No additional abnormal
parenchymal or meningeal enhancement. Encephalomalacia in the left
temporal lobe, sequela of remote infarct.

Vascular: Loss of the distal left vertebral artery flow void (series
4, image 5), with poor opacification in this location on
postcontrast imaging (series 12, image 21). Otherwise normal flow
voids. Venous sinuses are patent on postcontrast imaging. A patent
left posterior communicating artery is suspected on postcontrast
imaging (series 12, image 57).

Skull and upper cervical spine: Normal marrow signal.

Sinuses/Orbits: Clear paranasal sinuses. Status post bilateral lens
replacements.

Other: Trace fluid in the right mastoid air cells.
IMPRESSION: 1. Acute and/or subacute infarcts in the left frontal, occipital,
and parietal cortex and white matter. Contrast enhancement
associated with some of the left frontal cortical infarcts suggest
that these are subacute. No enhancement is associated with the
remainder of the infarcts, which are likely acute.
2. Loss of the distal left vertebral artery flow void, with poor
opacification on postcontrast imaging, concerning for severe
stenosis.

These results were called by telephone at the time of interpretation
on [DATE] at [DATE] to provider DR TIGER , who verbally
acknowledged these results.

## 2021-05-29 MED ORDER — ACETAMINOPHEN 325 MG PO TABS: 650.0000 mg | ORAL_TABLET | ORAL | Status: DC | PRN

## 2021-05-29 MED ORDER — LEVETIRACETAM IN NACL 1500 MG/100ML IV SOLN
1500.0000 mg | Freq: Once | INTRAVENOUS | Status: AC
  Administered 2021-05-29: 1500 mg via INTRAVENOUS
  Filled 2021-05-29: qty 100

## 2021-05-29 MED ORDER — ACETAMINOPHEN 650 MG RE SUPP: 650.0000 mg | RECTAL | Status: DC | PRN

## 2021-05-29 MED ORDER — INSULIN ASPART 100 UNIT/ML IJ SOLN
0.0000 [IU] | Freq: Three times a day (TID) | INTRAMUSCULAR | Status: DC
  Administered 2021-05-29: 5 [IU] via SUBCUTANEOUS
  Administered 2021-05-30: 15 [IU] via SUBCUTANEOUS
  Administered 2021-05-30: 3 [IU] via SUBCUTANEOUS
  Administered 2021-05-30: 8 [IU] via SUBCUTANEOUS
  Administered 2021-05-31: 15 [IU] via SUBCUTANEOUS
  Administered 2021-06-01: 5 [IU] via SUBCUTANEOUS
  Administered 2021-06-01 (×2): 15 [IU] via SUBCUTANEOUS
  Filled 2021-05-29: qty 1

## 2021-05-29 MED ORDER — ASPIRIN EC 81 MG PO TBEC
81.0000 mg | DELAYED_RELEASE_TABLET | Freq: Every day | ORAL | Status: DC
  Administered 2021-05-30 – 2021-05-31 (×2): 81 mg via ORAL
  Filled 2021-05-29 (×2): qty 1

## 2021-05-29 MED ORDER — SODIUM CHLORIDE 0.9 % IV BOLUS
1000.0000 mL | Freq: Once | INTRAVENOUS | Status: AC
  Administered 2021-05-29: 1000 mL via INTRAVENOUS

## 2021-05-29 MED ORDER — GADOBUTROL 1 MMOL/ML IV SOLN
8.5000 mL | Freq: Once | INTRAVENOUS | Status: AC | PRN
  Administered 2021-05-29: 8.5 mL via INTRAVENOUS

## 2021-05-29 MED ORDER — LEVETIRACETAM IN NACL 500 MG/100ML IV SOLN
500.0000 mg | Freq: Two times a day (BID) | INTRAVENOUS | Status: DC
  Administered 2021-05-30 – 2021-06-01 (×5): 500 mg via INTRAVENOUS
  Filled 2021-05-29 (×5): qty 100

## 2021-05-29 MED ORDER — ONDANSETRON HCL 4 MG/2ML IJ SOLN: 4.0000 mg | Freq: Four times a day (QID) | INTRAMUSCULAR | Status: DC | PRN

## 2021-05-29 MED ORDER — INSULIN ASPART 100 UNIT/ML IJ SOLN
0.0000 [IU] | Freq: Every day | INTRAMUSCULAR | Status: DC
  Administered 2021-05-29: 2 [IU] via SUBCUTANEOUS
  Administered 2021-05-30: 4 [IU] via SUBCUTANEOUS
  Administered 2021-05-31: 5 [IU] via SUBCUTANEOUS

## 2021-05-29 MED ORDER — CHLORHEXIDINE GLUCONATE 0.12% ORAL RINSE (MEDLINE KIT)
15.0000 mL | Freq: Two times a day (BID) | OROMUCOSAL | Status: DC
  Administered 2021-05-29 – 2021-05-31 (×5): 15 mL via OROMUCOSAL

## 2021-05-29 MED ORDER — ORAL CARE MOUTH RINSE
15.0000 mL | OROMUCOSAL | Status: DC
  Administered 2021-05-29 – 2021-05-30 (×6): 15 mL via OROMUCOSAL

## 2021-05-29 MED ORDER — CLOPIDOGREL BISULFATE 75 MG PO TABS
75.0000 mg | ORAL_TABLET | Freq: Every day | ORAL | Status: DC
  Administered 2021-05-30 – 2021-05-31 (×2): 75 mg via ORAL
  Filled 2021-05-29 (×2): qty 1

## 2021-05-29 MED ORDER — SODIUM CHLORIDE 0.9 % IV SOLN
INTRAVENOUS | Status: AC
Start: 2021-05-29 — End: 2021-05-30

## 2021-05-29 MED ORDER — STROKE: EARLY STAGES OF RECOVERY BOOK
Freq: Once | Status: AC
  Filled 2021-05-29: qty 1

## 2021-05-29 MED ORDER — ENOXAPARIN SODIUM 40 MG/0.4ML IJ SOSY
40.0000 mg | PREFILLED_SYRINGE | INTRAMUSCULAR | Status: DC
  Administered 2021-05-30 – 2021-05-31 (×2): 40 mg via SUBCUTANEOUS
  Filled 2021-05-29 (×2): qty 0.4

## 2021-05-29 MED ORDER — CLOPIDOGREL BISULFATE 75 MG PO TABS
300.0000 mg | ORAL_TABLET | Freq: Once | ORAL | Status: AC
  Administered 2021-05-29: 300 mg via ORAL
  Filled 2021-05-29: qty 4

## 2021-05-29 MED ORDER — ONDANSETRON HCL 4 MG PO TABS: 4.0000 mg | ORAL_TABLET | Freq: Four times a day (QID) | ORAL | Status: DC | PRN

## 2021-05-29 NOTE — Consult Note (Addendum)
Neurology Consult   Hunter Fields MR# [id number] 05/29/2021   CC: Right-sided facial twitching along with the right hand numbness and tingling  History is obtained from: Patient and chart.  [id number] H Hunter Fields is a 61 y.o. male with a past medical history of diabetes mellitus, hypertension, history of stroke about 7 years ago status post left carotid endarterectomy who was in his usual state of health till about 11:30 this morning when he was home and experienced twitching of the right side of his face.  Did not experience weakness on any one side of his body.  EMS was called by his spouse.  By the time EMS arrived his symptoms had resolved.  He then had another episode while in transit to the emergency department.  The facial twitching was also noted by the ED provider.  Patient also mentioned that he noted tingling and numbness in his right hand starting a few days prior, about Wednesday or Thursday.  Denies a similar symptoms previously.  No recent medication changes.  No other illness such as nausea vomiting or diarrhea.  No fever or chills.  No sick contacts.   In the emergency department neurology was consulted.  Patient was loaded with Keppra 1500 mg once,  EEG and MRI brain were ordered and pending at the time of this note.   ROS: A complete ROS was performed and is negative except as noted in the HPI.   Past Medical History:  Diagnosis Date   Allergy    Anxiety    Diabetes mellitus    Hyperlipidemia    Hypertension    Hypothyroid    Testosterone deficiency      History reviewed. No pertinent family history.  Social History:  reports that he has been smoking cigarettes. He has been smoking an average of .5 packs per day. He has never used smokeless tobacco. He reports current alcohol use of about 2.0 standard drinks per week. He reports that he does not use drugs.   Prior to Admission medications   Medication Sig Start Date End Date Taking? Authorizing Provider  b complex  vitamins tablet Take 1 tablet by mouth daily.      [provider]  Cholecalciferol (VITAMIN D) 2000 UNITS tablet Take 2,000 Units by mouth daily.      [provider]  clonazePAM Scarlette Calico) 2 MG tablet  01/21/14   [provider]  Cyanocobalamin (VITAMIN B 12 PO) Take 1,000 mcg by mouth daily.      [provider]  diazepam (VALIUM) 10 MG tablet Take 1/2 to 1 tablet by mouth 4 times daily for tremor. 03/11/15   Lucky Cowboy, MD  fenofibrate micronized (LOFIBRA) 134 MG capsule TAKE 1 CAPSULE DAILY FOR BLOOD FATS 02/10/14   Lucky Cowboy, MD  Flaxseed, Linseed, (FLAXSEED OIL PO) Take 1,200 mg by mouth. Takes 1 capsule daily    [provider]  glyBURIDE (DIABETA) 5 MG tablet Takes 1/2 to 1 tab po BID-TID daily for diabetes 10/20/13   Lucky Cowboy, MD  HYDROcodone-acetaminophen [facility]) 5-325 MG per tablet Take 1/2 to 1 tablet every 3 to 4 hours is needed for cough or pain 02/04/14   Lucky Cowboy, MD  levothyroxine (SYNTHROID) 75 MCG tablet Take 1 tablet (75 mcg total) by mouth daily. 02/10/14 02/10/15  Lucky Cowboy, MD  lisinopril (PRINIVIL,ZESTRIL) 40 MG tablet Take 1 tablet (40 mg total) by mouth daily. 02/10/14   Lucky Cowboy, MD  losartan-hydrochlorothiazide (HYZAAR) 100-25 MG per tablet Take 1 tablet  by mouth daily. 02/11/14 07/12/15  Lucky Cowboy, MD  metFORMIN (GLUCOPHAGE-XR) 500 MG 24 hr tablet TAKE 2 TABLETS TWICE A DAY FOR DIABETES MELLITUS 02/10/14   Lucky Cowboy, MD  Multiple Vitamin (MULTIVITAMIN) tablet Take 1 tablet by mouth daily.      [provider]  Omega-3 Fatty Acids (FISH OIL PO) Take 1,400 mg by mouth daily.      [provider]  propranolol (INDERAL) 80 MG tablet Take 80 mg by mouth daily.    [provider]  testosterone cypionate (DEPOTESTOSTERONE CYPIONATE) 200 MG/ML injection inject 2 ml's intramuscularly every 2 weeks as directed by physician & 3ml syringes and 1" 21 G needles 08/24/14  02/24/15  Lucky Cowboy, MD    Exam: Current vital signs: BP 134/83   Pulse 80   Temp 98.3 F (36.8 C) (Oral)   Resp 19   Ht 5\' 8"  (1.727 m)   SpO2 100%   BMI 28.79 kg/m   Physical Exam  Constitutional: Appears well-developed and well-nourished.  Psych: Affect appropriate to situation Eyes: No scleral injection HENT: No OP obstruction. Head: Normocephalic.  Cardiovascular: Normal rate and regular rhythm.  Respiratory: Effort normal, symmetric excursions bilaterally, no audible wheezing. GI: Soft.  No distension. There is no tenderness.  Skin: WDI  Neuro: Mental Status: Patient is awake, alert, oriented to person, place, month, year, and situation. Patient is able to give a clear and coherent history. Speech is fluent, intact comprehension and repetition. No signs of aphasia or neglect. Visual Fields are full. Pupils are equal, round, and reactive to light. EOMI without ptosis or diploplia.  Facial sensation is symmetric to temperature Facial movement is symmetric.  Hearing is intact to voice. Uvula midline and palate elevates symmetrically. Shoulder shrug is symmetric. Tongue is midline without atrophy or fasciculations.  Tone is normal. Bulk is normal. 5/5 strength was present in all four extremities. Sensation is symmetric to light touch and temperature in the arms and legs.However pt says pins and needles not numbness in right hand.  Toes are downgoing bilaterally. Right hand clumsy on FNF. HKS normal bilaterally Gait - Deferred  I have reviewed labs in epic and the pertinent results are: NA- 130, BUN-33, creatinine-1.48, Blood sugars 100's-400's  Assessment: Hunter Fields is a 61 y.o. male PMHx of anxiety, essential tremor, DM, HLD,HTN, hyperthyroid and testosterone deficiency who presented to [facility] ED with right side facial twitching and numbness and tingling in his right hand. Patient states he has never has a seizure before and denies new medications, known  infection and no recent head trauma.   Seen in ED, patient is A&Ox3. No focal neuro deficits. Patient says he feels back to baseline however still has tingling and pins and needles in right hand. Awaiting mRI and EEG results.   Impression:  Right side facial twitching and numbness and tingling in his right hand in 61 year old patient with no previous hx of seizures or stroke. Differential dx includes seizure and TIA with current resolution of symptoms, although favor seizure.   Plan: - MRI brain with/without contrast. - Recommend PT/OT/SLP consult. - Routine EEG to eval for any epileptogenic discharges- results pending - Recommend metabolic/infectious workup with CBC, CMP, Mag, UA with UCx, CXR, CK, serum lactate and all ordered.   This patient is critically ill and at significant risk of neurological worsening, death and care requires constant monitoring of vital signs, hemodynamics,respiratory and cardiac monitoring, neurological assessment, discussion with family, other specialists and medical decision making  of high complexity. I spent 50 minutes of neurocritical care time  in the care of  this patient. This was time spent independent of any time provided by nurse practitioner or PA.  Electronically signed by:  Boone Master, Neuro NP  Available via Epic  05/29/2021, 5:22 PM  If 7pm- 7am, please page neurology on call as listed in [facility].    Attending Neurologist's note:  Patient independently seen and examined.  On history he reports a feeling of oscillopsia that occurred 3 times today, lasting a few seconds at a time.  He does not report facial twitching, though this is clearly documented in the ED providers note.  Additionally he reports he has been having intermittent tingling in the hand since Thursday, and his wife corroborates that his hand was swollen and weak on Friday.  Regarding his prior stroke, wife notes he had a severe headache at onset and then was acting very strangely,  confused etc. and subsequently had a left carotid endarterectomy.  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  They both agree that he tends to minimize his symptoms, she helps him manage his medications and she confirms he is on aspirin monotherapy.  They both deny any recent bleeding issues  Last known well Thursday 4/27, no tPA due to out of the window on arrival, exam not consistent with LVO so no IA  On examination, general exam is unremarkable, mental status is notable for intermittent mild word finding difficulties such as being unable to name pinky.  Cranial nerve exam is notable for mild right facial droop and mild right eye palpebral fissure widening, which wife reports is chronic since his last stroke; additionally he does not reliably report number of fingers in the right lower quadrant.  Motor examination is notable for mild right deltoid weakness 4+/5, 5/5 elbow flexion and extension, but 4/5 hand strength, with notable slowing of fine motor movements of his hand.  Otherwise his strength is 5/5 throughout, reflexes are 2+ and symmetric in the biceps and patella, coordination is intact.  Gait was deferred.  I agree that NIH is 0 other than baseline right mild nasolabial fold flattening and very subtle aphasia too mild to score   Agree with radiology read of MRI, EEG additionally negative  Assessment: Right-handed 61 year old gentleman with multiple vascular risk factors including hypertension, hyperlipidemia, diabetes, prior stroke presenting with acute and subacute strokes in the left hemisphere, concerning for recurrent atheroembolic events from the vertebral circulation or left carotid.  Given the positive symptoms of tingling and right facial twitching, as well as a very cortical location of the strokes, there is a possibility of focal seizures  also.  EEG is negative but would be poorly sensitive given the small surface area of cortex involved for these focal symptoms.  Recommendations: -Dual antiplatelet therapy, Plavix 300 mg loading dose followed by 75 mg daily, continue aspirin 81 mg daily, course to be determined pending vessel imaging -MRA head to evaluate intracranial vasculature in more detail -Carotid ultrasound -Telemetry -Echocardiogram -Lipid panel, A1c; no statin on current home list, will need initiation pending LDL results and confirmation with wife/patient why he is not on it already  -Continue Keppra 500 mg twice daily given clinical concern for focal seizure -PT/OT/SLP -Stroke team to follow  I personally saw this patient, gathering history, performing a full neurologic examination,  reviewing relevant labs, personally reviewing relevant imaging including MRI brain, and formulated the assessment and plan as documented in my attestation

## 2021-05-29 NOTE — ED Notes (Signed)
RN called to check on pts status for MRI. MRI states 30-45 min before exam. EEG made aware  ?

## 2021-05-29 NOTE — ED Provider Notes (Signed)
Transfer of Care Note ?I assumed care of Hunter Fields on 05/29/2021 at '@NOWNR'$ @. ? ?Briefly, Hunter Fields is a 61 y.o. male who: ?- HTN, DM, HLD not medication compliant, prior stroke  ?- R ARM weakness, R side twitching, in route to the hospital R arm movt, twitching ?- Dr. Gayla Doss has seen, recommended seizure work-up including EEG ? ?  ?MR [place]    (Results Pending)  ? ?The plan includes: ?-Follow-up with admission team ? ? ?Please refer to the original provider?s note for additional information regarding the care of Hunter Fields. ? ?### ?Reassessment: ?I personally reassessed the patient: ? ?Vitals:  ? 05/29/21 1530 05/29/21 1600  ?BP: 128/86 134/83  ?Pulse: 81 80  ?Resp: (!) 21 19  ?Temp:    ?SpO2: 97% 100%  ? ? ?Additional MDM: ?-Patient has remained stable during the brief period to care for him.  I spoke with the admitting team and communicate all pertinent to admission.  No acute intervention required during my shift.  Patient is admitted. ? ?Dispo: admitted  ?  ?Donnamarie Poag, MD ?05/29/21 1807 ? ?  ?Teressa Lower, MD ?05/29/21 2332 ? ?

## 2021-05-29 NOTE — H&P (Signed)
?[facility] ?History and Physical ? ?Hunter Fields WRU:[id number] DOB: 09/17/1960 DOA: 05/29/2021 ? ? ?PCP: Pcp, No  ?Specialists: None ? ?Chief Complaint: Right-sided facial twitching along with the right hand numbness and tingling ? ?HPI: Hunter Fields is a 61 y.o. male with a past medical history of diabetes mellitus, hypertension, history of stroke about 7 years ago status post left carotid endarterectomy who was in his usual state of health till about 11:30 this morning when he was home and experienced twitching of the right side of his face.  Did not experience weakness on any one side of his body.  EMS was called by his spouse.  By the time EMS arrived his symptoms had resolved.  He then had another episode while in transit to the emergency department.  The facial twitching was also noted by the ED provider.  Patient also mentioned that he noted tingling and numbness in his right hand starting a few days prior, about Wednesday or Thursday.  Denies a similar symptoms previously.  No recent medication changes.  No other illness such as nausea vomiting or diarrhea.  No fever or chills.  No sick contacts. ? ?In the emergency department neurology was consulted.  Patient was loaded with Keppra.  EEG and MRI brain were ordered. ? ?Home Medications: Please note that this medication list is not reconciled.  This is an old list from 2016. ?Prior to Admission medications   ?Medication Sig Start Date End Date Taking? Authorizing Provider  ?b complex vitamins tablet Take 1 tablet by mouth daily.      [provider]  ?Cholecalciferol (VITAMIN D) 2000 UNITS tablet Take 2,000 Units by mouth daily.      [provider]  ?clonazePAM Bobbye [place]) 2 MG tablet  01/21/14   [provider]  ?Cyanocobalamin (VITAMIN B 12 PO) Take 1,000 mcg by mouth daily.      [provider]  ?diazepam (VALIUM) 10 MG tablet Take 1/2 to 1 tablet by mouth 4 times daily for tremor. 03/11/15   Unk Pinto,  MD  ?fenofibrate micronized (LOFIBRA) 134 MG capsule TAKE 1 CAPSULE DAILY FOR BLOOD FATS 02/10/14   Unk Pinto, MD  ?Flaxseed, Linseed, (FLAXSEED OIL PO) Take 1,200 mg by mouth. Takes 1 capsule daily    [provider]  ?glyBURIDE (DIABETA) 5 MG tablet Takes 1/2 to 1 tab po BID-TID daily for diabetes 10/20/13   Unk Pinto, MD  ?HYDROcodone-acetaminophen [facility]) 5-325 MG per tablet Take 1/2 to 1 tablet every 3 to 4 hours is needed for cough or pain 02/04/14   Unk Pinto, MD  ?levothyroxine (SYNTHROID) 75 MCG tablet Take 1 tablet (75 mcg total) by mouth daily. 02/10/14 02/10/15  Unk Pinto, MD  ?lisinopril (PRINIVIL,ZESTRIL) 40 MG tablet Take 1 tablet (40 mg total) by mouth daily. 02/10/14   Unk Pinto, MD  ?losartan-hydrochlorothiazide (HYZAAR) 100-25 MG per tablet Take 1 tablet by mouth daily. 02/11/14 07/12/15  Unk Pinto, MD  ?metFORMIN (GLUCOPHAGE-XR) 500 MG 24 hr tablet TAKE 2 TABLETS TWICE A DAY FOR DIABETES MELLITUS 02/10/14   Unk Pinto, MD  ?Multiple Vitamin (MULTIVITAMIN) tablet Take 1 tablet by mouth daily.      [provider]  ?Omega-3 Fatty Acids (FISH OIL PO) Take 1,400 mg by mouth daily.      [provider]  ?propranolol (INDERAL) 80 MG tablet Take 80 mg by mouth daily.    [provider]  ?testosterone cypionate (DEPOTESTOSTERONE CYPIONATE) 200 MG/ML injection inject 2 ml's intramuscularly  every 2 weeks as directed by physician & 87m syringes and 1" 21 G needles 08/24/14 02/24/15  MUnk Pinto MD  ? ? ?Allergies:  ?Allergies  ?Allergen Reactions  ? Food Swelling  ?  MUSHROOMS=SWELLING  ? ? ?Past Medical History: ?Past Medical History:  ?Diagnosis Date  ? Allergy   ? Anxiety   ? Diabetes mellitus   ? Hyperlipidemia   ? Hypertension   ? Hypothyroid   ? Testosterone deficiency   ? ? ?Past Surgical History:  ?Procedure Laterality Date  ? APPENDECTOMY  2006  ? COLON SURGERY  2006  ? large diverticulum resected  ? COLONOSCOPY   09/23/2010  ? diverticulosis, lymphoid rectal polyp  ? ? ?Social History: Lives with his spouse.  Smokes half pack of cigarettes on a daily basis.  Drinks 2 alcoholic beverages during the course of a week.  Denies any illicit drug use.  Independent with daily activities.  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 ? ?Family History: Reports history of diabetes hypertension and cancer in the family.  Unable to specify any further. ? ?Review of Systems - History obtained from the patient ?General ROS: positive for  - fatigue ?Psychological ROS: negative ?Ophthalmic ROS: negative ?ENT ROS: negative ?Allergy and Immunology ROS: negative ?Hematological and Lymphatic ROS: negative ?Endocrine ROS: negative ?Respiratory ROS: no cough, shortness of breath, or wheezing ?Cardiovascular ROS: no chest pain or dyspnea on exertion ?Gastrointestinal ROS: no abdominal pain, change in bowel habits, or black or bloody stools ?Genito-Urinary ROS: no dysuria, trouble voiding, or hematuria ?Musculoskeletal ROS: negative ?Neurological ROS: As in HPI ?Dermatological ROS: negative ? ?Physical Examination ? ?Vitals:  ? 05/29/21 1234 05/29/21 1345 05/29/21 1530 05/29/21 1600  ?BP:  123/81 128/86 134/83  ?Pulse:  85 81 80  ?Resp:  19 (!) 21 19  ?Temp:      ?TempSrc:      ?SpO2:  99% 97% 100%  ?Height: '5\' 8"'$  (1.727 m)     ? ? ?BP 134/83   Pulse 80   Temp 98.3 ?F (36.8 ?C) (Oral)   Resp 19   Ht '5\' 8"'$  (1.727 m)   SpO2 100%   BMI 28.79 kg/m?  ? ?General appearance: alert, cooperative, appears stated age, and no distress ?Head: Normocephalic, without obvious abnormality, atraumatic ?Eyes: conjunctivae/corneas clear. PERRL, EOM's intact.  ?Throat: lips, mucosa, and tongue normal; teeth and gums normal ?Neck: no adenopathy, no carotid bruit, no JVD, supple, symmetrical, trachea midline, and thyroid not enlarged, symmetric, no tenderness/mass/nodules ?Resp: clear to auscultation bilaterally ?Cardio: regular rate and rhythm, S1, S2  normal, no murmur, click, rub or gallop ?GI: soft, non-tender; bowel sounds normal; no masses,  no organomegaly ?Extremities: extremities normal, atraumatic, no cyanosis or edema ?Pulses: 2+ and symmetric ?Skin: Skin color, texture, turgor normal. No rashes or lesions ?Lymph nodes: Cervical, supraclavicular, and axillary nodes normal. ?Neurologic: Alert and oriented x3.  No obvious facial asymmetry noted.  Cranial nerves II to XII seem to be intact.  Motor strength equal bilateral upper and lower extremity.  Subtle weakness noted in the right hand though.  Finger-nose on the right was mildly off compared to the left. ? ? ? ?Labs on Admission: I have personally reviewed following labs and imaging studies ? ?CBC: ?Recent Labs  ?Lab 05/29/21 ?1246  ?WBC 6.5  ?NEUTROABS 4.8  ?HGB 13.5  ?HCT 40.5  ?MCV 87.5  ?PLT 278  ? ?Basic Metabolic Panel: ?Recent Labs  ?Lab 05/29/21 ?1246  ?NA 130*  ?K  4.4  ?CL 102  ?CO2 20*  ?GLUCOSE 392*  ?BUN 33*  ?CREATININE 1.48*  ?CALCIUM 8.9  ? ?GFR: ?CrCl cannot be calculated (Unknown ideal weight.). ? ?CBG: ?Recent Labs  ?Lab 05/29/21 ?1239 05/29/21 ?1347 05/29/21 ?1651  ?GLUCAP 430* 396* 257*  ? ? ?Radiological Exams on Admission: ?No results found. ? ?My interpretation of Electrocardiogram: Pending ? ? ?Problem List ? ?Principal Problem: ?  Numbness and tingling in right hand ?Active Problems: ?  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  Hyperlipidemia ?  Hypertension ?  Facial twitching ?  Hyponatremia ?  CKD (chronic kidney disease) stage 2, GFR 60-89 ml/min ? ? ?Assessment: This is a 61 year old Caucasian male with a past medical history as stated earlier who comes in with right-sided facial twitching and numbness and tingling in his right hand.  Symptoms concerning for focal seizure activity.  Patient with previous history of stroke about 7 years ago. ? ?Plan: ? ?#1. Right-sided facial twitching and numbness of the right hand: Concern for seizure activity.  MRI brain is pending.   EEG is pending.  Neurology has been consulted by the ED provider.  Check magnesium level.  Sodium level noted to be low but unlikely to be responsible for his seizure by itself.  Urine drug screen.  Patient has been load

## 2021-05-29 NOTE — Progress Notes (Signed)
?  X-cover Note: ?Received call from radiology. MRI brain shows multiple areas of acute and subacute CVA on left hemisphere. ??vertebral stenosis  ??left ICA stenosis.   ? ?Verbal report taken. ? ?Awaiting final report. ? ? ?Kristopher Oppenheim, DO ?[facility] ? ?

## 2021-05-29 NOTE — Progress Notes (Signed)
EEG complete - results pending 

## 2021-05-29 NOTE — ED Triage Notes (Signed)
Pt coming from home with [facility]. Sitting on couch when he felt eye flickering and numbness in his right arm. No hx of seizures. EMS witnessed seizure and noted pt to be conscious during seizure but pt eyes twitching and right arm numb, cant squeeze, but able to talk. Seizure lasted 20-30 seconds. Pt noted numbness in right wrist on Thursday.  ?2.5 Midazolam, 20 g R wrist, 100 mL fluids PTA ? ? ?140/90 ?CBG 447 ?HR 90 ? ?

## 2021-05-29 NOTE — Procedures (Signed)
Routine EEG Report ? ?[name] is a 61 y.o. male with a history of spells who is undergoing an EEG to evaluate for seizures. ? ?Report: This EEG was acquired with electrodes placed according to the International 10-20 electrode system (including Fp1, Fp2, F3, F4, C3, C4, P3, P4, O1, O2, T3, T4, T5, T6, A1, A2, Fz, Cz, Pz). The following electrodes were missing or displaced: none. ? ?The occipital dominant rhythm was 9 Hz. This activity is reactive to stimulation. Drowsiness was manifested by background fragmentation; deeper stages of sleep were not identified. There was no focal slowing. There were no interictal epileptiform discharges. There were no electrographic seizures identified. There was no abnormal response to photic stimulation or hyperventilation.  ? ?Impression: This EEG was obtained while awake and drowsy and is normal. ?   ?Clinical Correlation: Normal EEGs, however, do not rule out epilepsy. ? ?Su Monks, MD ?[facility] ?[phone] ? ?If 7pm- 7am, please page neurology on call as listed in [place]. ? ?

## 2021-05-29 NOTE — ED Provider Notes (Signed)
?[name] ?Provider Note ? ? ?CSN: [id number] ?Arrival date & time: 05/29/21  1224 ? ?  ? ?History ? ?Chief Complaint  ?Patient presents with  ? Seizures  ? ? ?Hunter Fields is a 61 y.o. male. ? ?61 year old male brought in by EMS from home with complaint of facial twitching.  The patient was sitting on the sofa with his wife when he began having twitching of the right side of his face and wife called 911.  Episode had resolved by the time EMS arrived.  EMS notes they did have another episode while in transit to the emergency room and vomiting the right side of the face with some movement of the right arm, patient was given midazolam in route to the emergency room.  On arrival, patient is able to give his history, does experience a brief episode lasting about 30 seconds where he had facial twitching, primarily involving the right side of his face, was able to communicate with me throughout the episode, resolved after about 30 seconds. ?No history of similar symptoms previously. ? ?Past medical history of hypertension, hyperlipidemia, diabetes, prior CVA 7 years ago. ? ? ?  ? ?Home Medications ?Prior to Admission medications   ?Medication Sig Start Date End Date Taking? Authorizing Provider  ?b complex vitamins tablet Take 1 tablet by mouth daily.      [provider]  ?Cholecalciferol (VITAMIN D) 2000 UNITS tablet Take 2,000 Units by mouth daily.      [provider]  ?clonazePAM [facility]) 2 MG tablet  01/21/14   [provider]  ?Cyanocobalamin (VITAMIN B 12 PO) Take 1,000 mcg by mouth daily.      [provider]  ?diazepam (VALIUM) 10 MG tablet Take 1/2 to 1 tablet by mouth 4 times daily for tremor. 03/11/15   Unk Pinto, MD  ?fenofibrate micronized (LOFIBRA) 134 MG capsule TAKE 1 CAPSULE DAILY FOR BLOOD FATS 02/10/14   Unk Pinto, MD  ?Flaxseed, Linseed, (FLAXSEED OIL PO) Take 1,200 mg by mouth. Takes 1 capsule daily     [provider]  ?glyBURIDE (DIABETA) 5 MG tablet Takes 1/2 to 1 tab po BID-TID daily for diabetes 10/20/13   Unk Pinto, MD  ?HYDROcodone-acetaminophen [facility]) 5-325 MG per tablet Take 1/2 to 1 tablet every 3 to 4 hours is needed for cough or pain 02/04/14   Unk Pinto, MD  ?levothyroxine (SYNTHROID) 75 MCG tablet Take 1 tablet (75 mcg total) by mouth daily. 02/10/14 02/10/15  Unk Pinto, MD  ?lisinopril (PRINIVIL,ZESTRIL) 40 MG tablet Take 1 tablet (40 mg total) by mouth daily. 02/10/14   Unk Pinto, MD  ?losartan-hydrochlorothiazide (HYZAAR) 100-25 MG per tablet Take 1 tablet by mouth daily. 02/11/14 07/12/15  Unk Pinto, MD  ?metFORMIN (GLUCOPHAGE-XR) 500 MG 24 hr tablet TAKE 2 TABLETS TWICE A DAY FOR DIABETES MELLITUS 02/10/14   Unk Pinto, MD  ?Multiple Vitamin (MULTIVITAMIN) tablet Take 1 tablet by mouth daily.      [provider]  ?Omega-3 Fatty Acids (FISH OIL PO) Take 1,400 mg by mouth daily.      [provider]  ?propranolol (INDERAL) 80 MG tablet Take 80 mg by mouth daily.    [provider]  ?testosterone cypionate (DEPOTESTOSTERONE CYPIONATE) 200 MG/ML injection inject 2 ml's intramuscularly every 2 weeks as directed by physician & 66m syringes and 1" 21 G needles 08/24/14 02/24/15  MUnk Pinto MD  ?   ? ?Allergies    ?Food   ? ?  Review of Systems   ?Review of Systems ?Negative except as per HPI ?Physical Exam ?Updated Vital Signs ?BP 128/86   Pulse 81   Temp 98.3 ?F (36.8 ?C) (Oral)   Resp (!) 21   Ht '5\' 8"'$  (1.727 m)   SpO2 97%   BMI 28.79 kg/m?  ?Physical Exam ?Vitals and nursing note reviewed.  ?Constitutional:   ?   General: He is not in acute distress. ?   Appearance: He is well-developed. He is not diaphoretic.  ?HENT:  ?   Head: Normocephalic and atraumatic.  ?   Nose: Nose normal.  ?   Mouth/Throat:  ?   Mouth: Mucous membranes are moist.  ?Eyes:  ?   Extraocular Movements: Extraocular movements intact.  ?   Pupils:  Pupils are equal, round, and reactive to light.  ?Cardiovascular:  ?   Rate and Rhythm: Normal rate and regular rhythm.  ?   Heart sounds: Normal heart sounds.  ?Pulmonary:  ?   Effort: Pulmonary effort is normal.  ?   Breath sounds: Normal breath sounds.  ?Musculoskeletal:     ?   General: No tenderness or deformity.  ?   Cervical back: Neck supple.  ?Skin: ?   General: Skin is warm and dry.  ?   Findings: No erythema or rash.  ?Neurological:  ?   Mental Status: He is alert and oriented to person, place, and time.  ?   Sensory: No sensory deficit.  ?   Motor: Weakness present.  ?Psychiatric:     ?   Behavior: Behavior normal.  ? ? ?ED Results / Procedures / Treatments   ?Labs ?(all labs ordered are listed, but only abnormal results are displayed) ?Labs Reviewed  ?BASIC METABOLIC PANEL - Abnormal; Notable for the following components:  ?    Result Value  ? Sodium 130 (*)   ? CO2 20 (*)   ? Glucose, Bld 392 (*)   ? BUN 33 (*)   ? Creatinine, Ser 1.48 (*)   ? GFR, Estimated 53 (*)   ? All other components within normal limits  ?CBG MONITORING, ED - Abnormal; Notable for the following components:  ? Glucose-Capillary 430 (*)   ? All other components within normal limits  ?CBG MONITORING, ED - Abnormal; Notable for the following components:  ? Glucose-Capillary 396 (*)   ? All other components within normal limits  ?CBC WITH DIFFERENTIAL/PLATELET  ?CBG MONITORING, ED  ?CBG MONITORING, ED  ? ? ?EKG ?None ? ?Radiology ?No results found. ? ?Procedures ?Procedures  ? ? ?Medications Ordered in ED ?Medications  ?sodium chloride 0.9 % bolus 1,000 mL (1,000 mLs Intravenous New Bag/Given 05/29/21 1331)  ?levETIRAcetam (KEPPRA) IVPB 1500 mg/ 100 mL premix (0 mg Intravenous Stopped 05/29/21 1413)  ? ? ?ED Course/ Medical Decision Making/ A&P ?  ?                        ?Medical Decision Making ?Amount and/or Complexity of Data Reviewed ?Labs: ordered. ? ? ?This patient presents to the ED for concern of abnormal facial movement,  right arm weakness, this involves an extensive number of treatment options, and is a complaint that carries with it a high risk of complications and morbidity.  The differential diagnosis includes partial seizure, CVA, mass, electrolyte abnormality ? ? ?Co morbidities that complicate the patient evaluation ? ?Diabetes, hypertension, lipidemia, prior CVA ? ? ?Additional history obtained: ? ?Additional history obtained from EMS who  reports patient had episode of right-sided facial twitching and raising his right arm while in route to the emergency room, given midazolam ?External records from outside source obtained and reviewed including PCP visit on 12/07/2020 difficulty with medication compliance secondary to recent loss of insurance ? ? ?Lab Tests: ? ?I Ordered, and personally interpreted labs.  The pertinent results include: CBC unremarkable, BMP with hyponatremia with sodium of 130, likely secondary to his hyperglycemia with a glucose of 392.  Creatinine 1.48. ? ? ?Imaging Studies ordered: ? ?I ordered imaging studies including MRI brain ?Pending completion of study and read by radiologist ? ? ?Consultations Obtained: ? ?I requested consultation with the neuro hospitalist, Dr. Curly Shores,  and discussed lab and imaging findings as well as pertinent plan - they recommend: MRI brain with and without contrast, EEG, correct sodium and administer antiepileptics ? ? ?Problem List / ED Course / Critical interventions / Medication management ? ?61 year old male with complaint of right-sided facial twitching with involvement of the right arm.  Patient arrives in the emergency room via EMS without his wife initially, reports right-sided facial twitching which started today while sitting on the sofa with his wife.  When wife arrives in the emergency room, adds that patient has had some right arm weakness since Friday.  Patient did have an episode of what appears to be a partial seizure or involvement of the right side face  during my exam.  He uses his left hand to pick up his right arm to try and close his right eye throughout the episode.  He is able to talk throughout the episode as well. ?I ordered medication including Keppra, IV fluids for se

## 2021-05-29 NOTE — ED Notes (Signed)
Patient to MRI.

## 2021-05-30 ENCOUNTER — Observation Stay (HOSPITAL_COMMUNITY): Payer: Self-pay

## 2021-05-30 ENCOUNTER — Encounter (HOSPITAL_COMMUNITY): Payer: Self-pay

## 2021-05-30 DIAGNOSIS — E039 Hypothyroidism, unspecified: Secondary | ICD-10-CM | POA: Diagnosis present

## 2021-05-30 DIAGNOSIS — Z8249 Family history of ischemic heart disease and other diseases of the circulatory system: Secondary | ICD-10-CM | POA: Diagnosis not present

## 2021-05-30 DIAGNOSIS — E861 Hypovolemia: Secondary | ICD-10-CM | POA: Diagnosis present

## 2021-05-30 DIAGNOSIS — E781 Pure hyperglyceridemia: Secondary | ICD-10-CM | POA: Diagnosis present

## 2021-05-30 DIAGNOSIS — I63232 Cerebral infarction due to unspecified occlusion or stenosis of left carotid arteries: Secondary | ICD-10-CM | POA: Diagnosis present

## 2021-05-30 DIAGNOSIS — R569 Unspecified convulsions: Secondary | ICD-10-CM | POA: Diagnosis present

## 2021-05-30 DIAGNOSIS — I129 Hypertensive chronic kidney disease with stage 1 through stage 4 chronic kidney disease, or unspecified chronic kidney disease: Secondary | ICD-10-CM | POA: Diagnosis present

## 2021-05-30 DIAGNOSIS — R29700 NIHSS score 0: Secondary | ICD-10-CM | POA: Diagnosis present

## 2021-05-30 DIAGNOSIS — E1122 Type 2 diabetes mellitus with diabetic chronic kidney disease: Secondary | ICD-10-CM | POA: Diagnosis present

## 2021-05-30 DIAGNOSIS — I639 Cerebral infarction, unspecified: Secondary | ICD-10-CM | POA: Diagnosis present

## 2021-05-30 DIAGNOSIS — Z8673 Personal history of transient ischemic attack (TIA), and cerebral infarction without residual deficits: Secondary | ICD-10-CM | POA: Diagnosis not present

## 2021-05-30 DIAGNOSIS — E1165 Type 2 diabetes mellitus with hyperglycemia: Secondary | ICD-10-CM | POA: Diagnosis present

## 2021-05-30 DIAGNOSIS — I6310 Cerebral infarction due to embolism of unspecified precerebral artery: Secondary | ICD-10-CM

## 2021-05-30 DIAGNOSIS — E871 Hypo-osmolality and hyponatremia: Secondary | ICD-10-CM | POA: Diagnosis present

## 2021-05-30 DIAGNOSIS — Z7982 Long term (current) use of aspirin: Secondary | ICD-10-CM | POA: Diagnosis not present

## 2021-05-30 DIAGNOSIS — N182 Chronic kidney disease, stage 2 (mild): Secondary | ICD-10-CM | POA: Diagnosis present

## 2021-05-30 DIAGNOSIS — Z9889 Other specified postprocedural states: Secondary | ICD-10-CM

## 2021-05-30 DIAGNOSIS — F17210 Nicotine dependence, cigarettes, uncomplicated: Secondary | ICD-10-CM | POA: Diagnosis present

## 2021-05-30 DIAGNOSIS — Z833 Family history of diabetes mellitus: Secondary | ICD-10-CM | POA: Diagnosis not present

## 2021-05-30 DIAGNOSIS — Z79899 Other long term (current) drug therapy: Secondary | ICD-10-CM | POA: Diagnosis not present

## 2021-05-30 DIAGNOSIS — I6389 Other cerebral infarction: Secondary | ICD-10-CM

## 2021-05-30 DIAGNOSIS — Z91018 Allergy to other foods: Secondary | ICD-10-CM | POA: Diagnosis not present

## 2021-05-30 DIAGNOSIS — I6522 Occlusion and stenosis of left carotid artery: Secondary | ICD-10-CM

## 2021-05-30 LAB — ECHOCARDIOGRAM COMPLETE
AR max vel: 2.82 cm2
AV Peak grad: 6.8 mmHg
Ao pk vel: 1.3 m/s
Area-P 1/2: 3.53 cm2
Height: 68 in
S' Lateral: 3.6 cm
Single Plane A4C EF: 40.5 %
Weight: 2515.01 oz

## 2021-05-30 LAB — URINALYSIS, ROUTINE W REFLEX MICROSCOPIC
Bilirubin Urine: NEGATIVE
Glucose, UA: 150 mg/dL — AB
Hgb urine dipstick: NEGATIVE
Ketones, ur: NEGATIVE mg/dL
Leukocytes,Ua: NEGATIVE
Nitrite: NEGATIVE
Protein, ur: NEGATIVE mg/dL
Specific Gravity, Urine: 1.015 (ref 1.005–1.030)
pH: 5 (ref 5.0–8.0)

## 2021-05-30 LAB — LIPID PANEL
Cholesterol: 249 mg/dL — ABNORMAL HIGH (ref 0–200)
HDL: 30 mg/dL — ABNORMAL LOW (ref >40)
LDL Cholesterol: UNDETERMINED mg/dL (ref 0–99)
Total CHOL/HDL Ratio: 8.3 RATIO
Triglycerides: 782 mg/dL — ABNORMAL HIGH (ref <150)
VLDL: UNDETERMINED mg/dL (ref 0–40)

## 2021-05-30 LAB — OSMOLALITY, URINE: Osmolality, Ur: 556 mOsm/kg (ref 300–900)

## 2021-05-30 LAB — CBC
HCT: 42.7 % (ref 39.0–52.0)
Hemoglobin: 14.5 g/dL (ref 13.0–17.0)
MCH: 29.5 pg (ref 26.0–34.0)
MCHC: 34 g/dL (ref 30.0–36.0)
MCV: 87 fL (ref 80.0–100.0)
Platelets: 302 10*3/uL (ref 150–400)
RBC: 4.91 MIL/uL (ref 4.22–5.81)
RDW: 12.5 % (ref 11.5–15.5)
WBC: 6.2 10*3/uL (ref 4.0–10.5)
nRBC: 0 % (ref 0.0–0.2)

## 2021-05-30 LAB — SURGICAL PCR SCREEN
MRSA, PCR: NEGATIVE
Staphylococcus aureus: NEGATIVE

## 2021-05-30 LAB — COMPREHENSIVE METABOLIC PANEL
ALT: 18 U/L (ref 0–44)
AST: 17 U/L (ref 15–41)
Albumin: 3.5 g/dL (ref 3.5–5.0)
Alkaline Phosphatase: 72 U/L (ref 38–126)
Anion gap: 11 (ref 5–15)
BUN: 26 mg/dL — ABNORMAL HIGH (ref 8–23)
CO2: 24 mmol/L (ref 22–32)
Calcium: 9.3 mg/dL (ref 8.9–10.3)
Chloride: 103 mmol/L (ref 98–111)
Creatinine, Ser: 1.33 mg/dL — ABNORMAL HIGH (ref 0.61–1.24)
GFR, Estimated: >60 mL/min (ref >60)
Glucose, Bld: 88 mg/dL (ref 70–99)
Potassium: 3.8 mmol/L (ref 3.5–5.1)
Sodium: 138 mmol/L (ref 135–145)
Total Bilirubin: 0.3 mg/dL (ref 0.3–1.2)
Total Protein: 6.7 g/dL (ref 6.5–8.1)

## 2021-05-30 LAB — RAPID URINE DRUG SCREEN, HOSP PERFORMED
Amphetamines: NOT DETECTED
Barbiturates: NOT DETECTED
Benzodiazepines: POSITIVE — AB
Cocaine: NOT DETECTED
Opiates: NOT DETECTED
Tetrahydrocannabinol: NOT DETECTED

## 2021-05-30 LAB — CK: Total CK: 96 U/L (ref 49–397)

## 2021-05-30 LAB — GLUCOSE, CAPILLARY
Glucose-Capillary: 194 mg/dL — ABNORMAL HIGH (ref 70–99)
Glucose-Capillary: 365 mg/dL — ABNORMAL HIGH (ref 70–99)
Glucose-Capillary: 257 mg/dL — ABNORMAL HIGH (ref 70–99)
Glucose-Capillary: 347 mg/dL — ABNORMAL HIGH (ref 70–99)

## 2021-05-30 LAB — LDL CHOLESTEROL, DIRECT: Direct LDL: 82 mg/dL (ref 0–99)

## 2021-05-30 LAB — HIV ANTIBODY (ROUTINE TESTING W REFLEX): HIV Screen 4th Generation wRfx: NONREACTIVE

## 2021-05-30 LAB — LACTIC ACID, PLASMA: Lactic Acid, Venous: 1.2 mmol/L (ref 0.5–1.9)

## 2021-05-30 LAB — TSH: TSH: 4.507 u[IU]/mL — ABNORMAL HIGH (ref 0.350–4.500)

## 2021-05-30 LAB — SODIUM, URINE, RANDOM: Sodium, Ur: 111 mmol/L

## 2021-05-30 LAB — MAGNESIUM: Magnesium: 1.6 mg/dL — ABNORMAL LOW (ref 1.7–2.4)

## 2021-05-30 IMAGING — MR MR MRA HEAD W/O CM
1 series · 19 of 48 positions shown · non-contrast
Comparison: Brain MRI from yesterday

CLINICAL DATA: Right-sided facial twitching.  Right hand numbness.

EXAM:
MRA HEAD WITHOUT CONTRAST
TECHNIQUE: Angiographic images of the Circle of Willis were acquired using MRA
technique without intravenous contrast.

[Series 5: 3d cow · axial · 0.5mm · 0.41mm/px · z∈[-42,+34]mm · 19 of 160 slices shown]
[im 1/160]
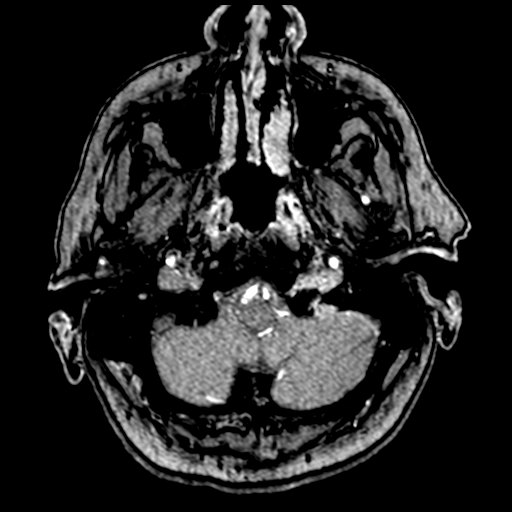
[im 4/160]
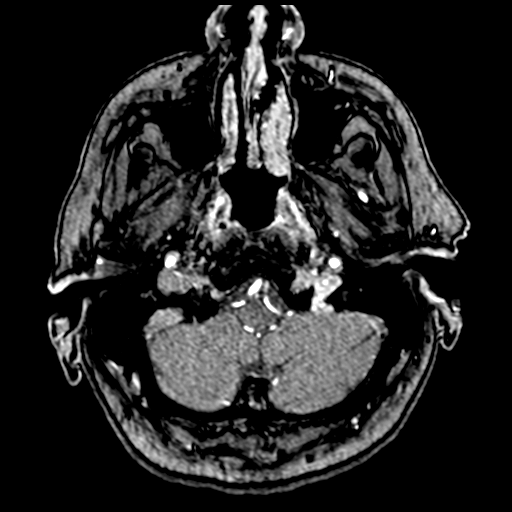
[im 7/160]
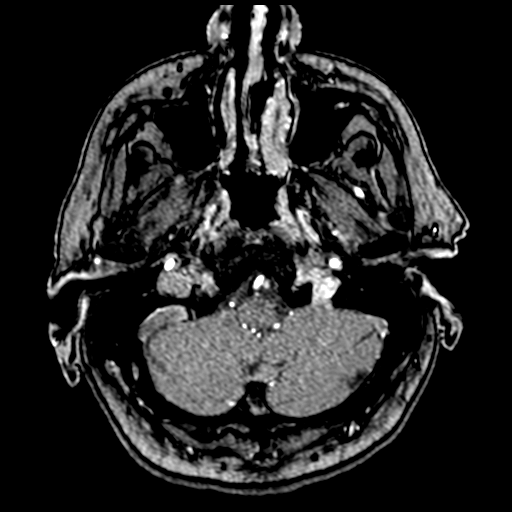
[im 11/160]
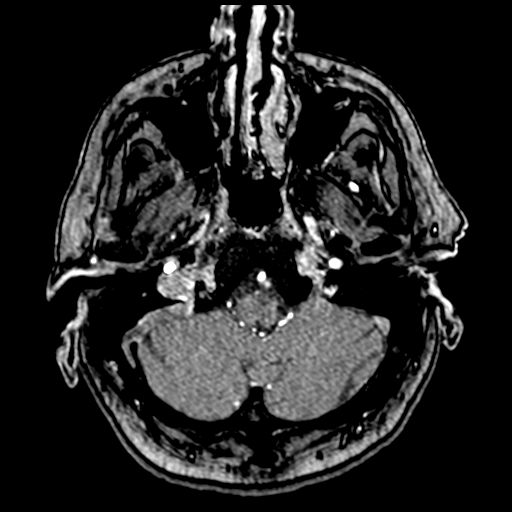
[im 14/160]
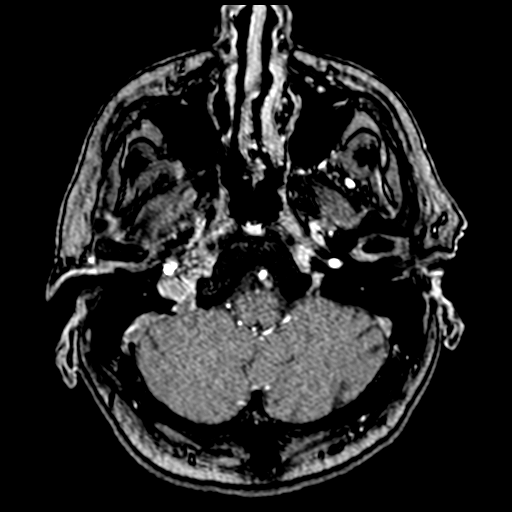
[im 17/160]
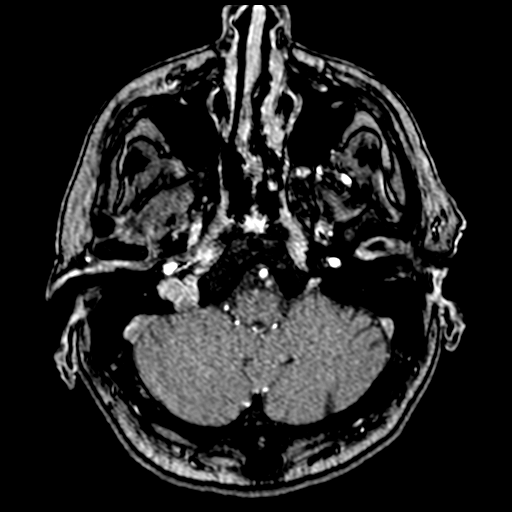
[im 21/160]
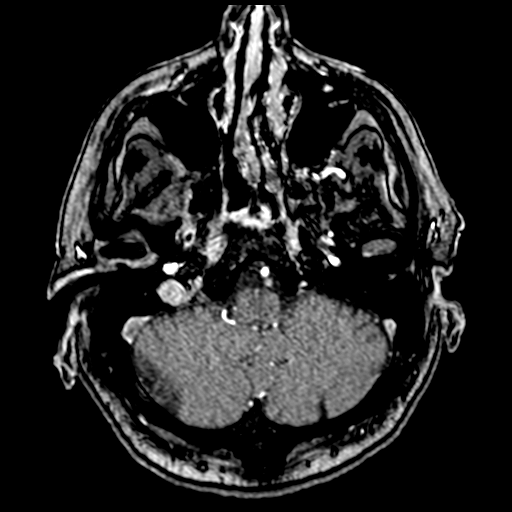
[im 24/160]
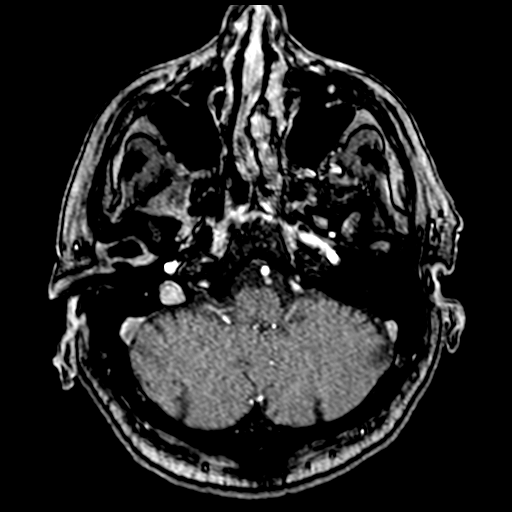
[im 28/160]
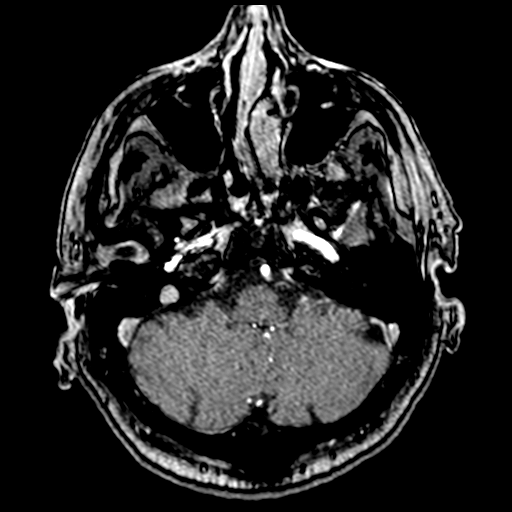
[im 31/160]
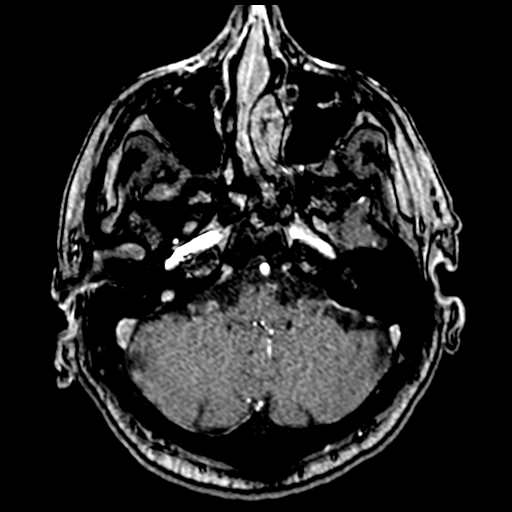
[im 34/160]
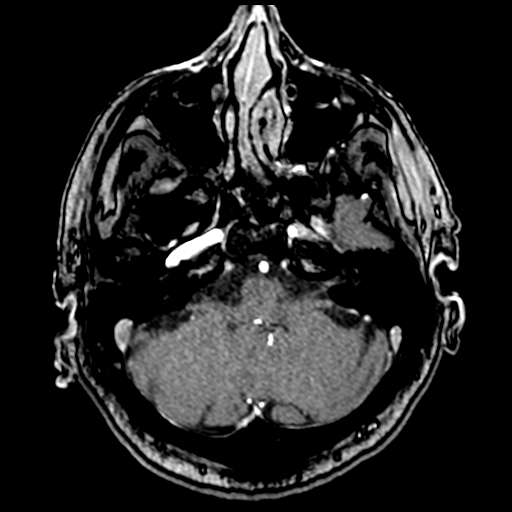
[im 51/160]
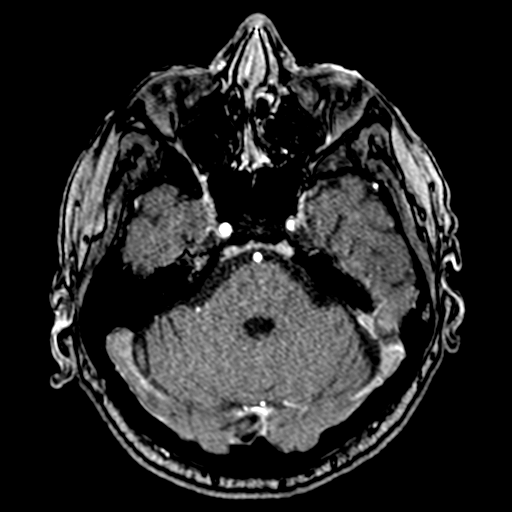
[im 72/160]
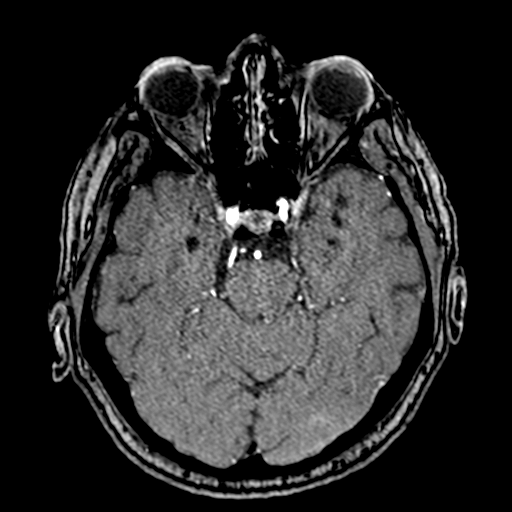
[im 82/160]
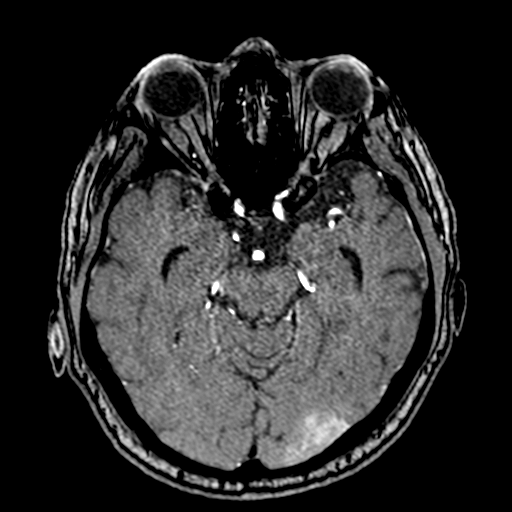
[im 92/160]
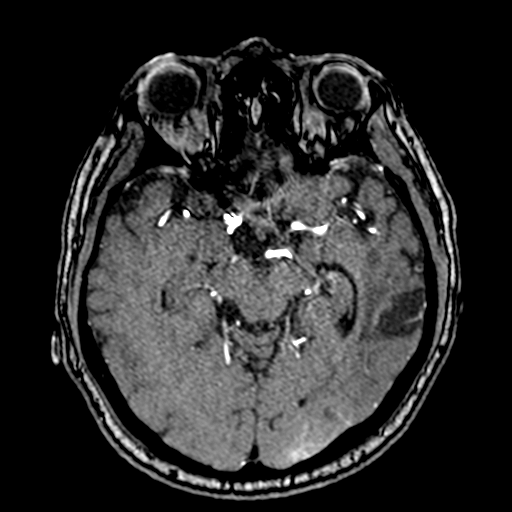
[im 112/160]
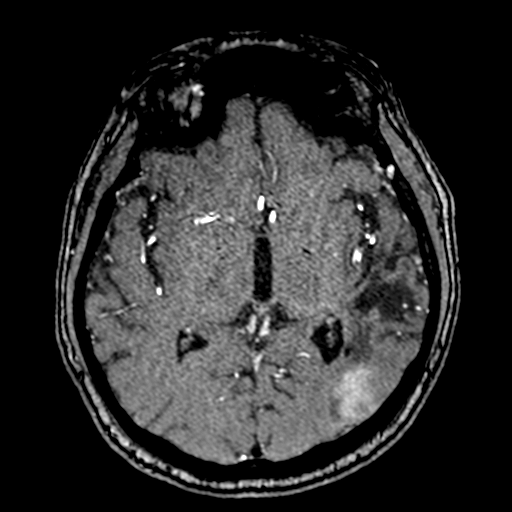
[im 132/160]
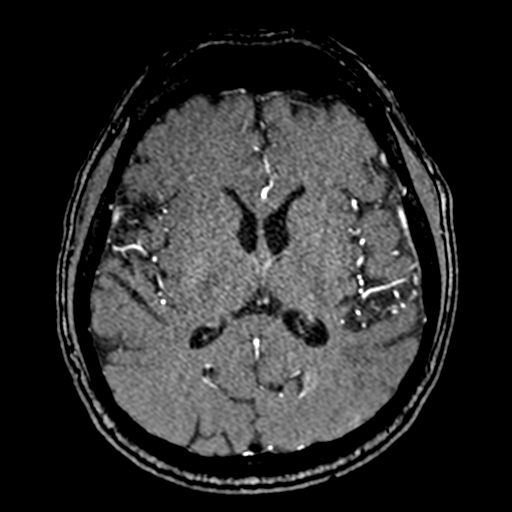
[im 136/160]
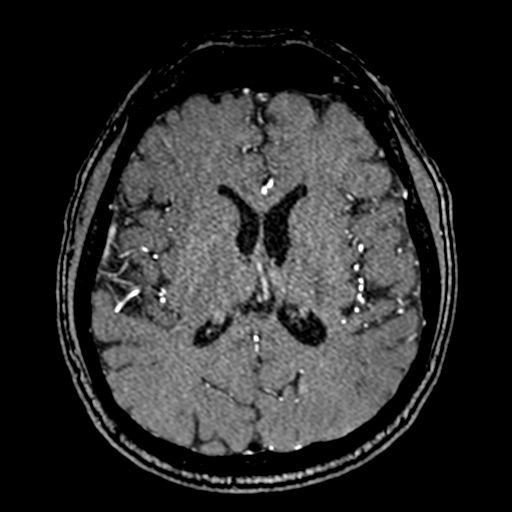
[im 153/160]
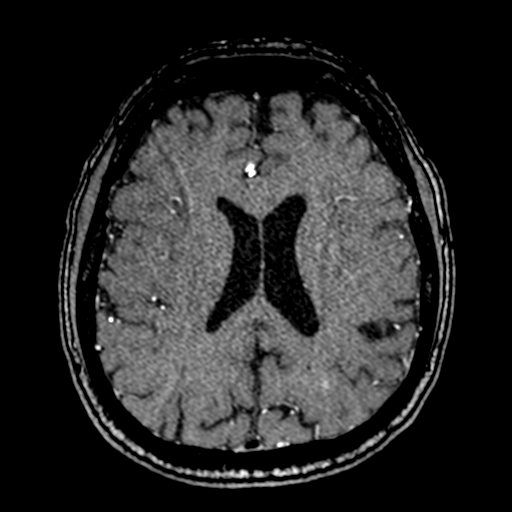

[19 of 48 positions shown; findings below may reference images not displayed]

FINDINGS: Anterior circulation: Atheromatous irregularity of the carotid
siphons. Of the left ICA is smaller than the right, likely due to
circle-of-Willis anatomy with fetal type right PCA and hypoplastic
left A1 segment. Atheromatous irregularity of the left A1 segment
and bilateral MCA branches. No major branch occlusion or proximal
flow limiting stenosis.

Posterior circulation: Absent distal left vertebral artery beyond
the PICA. There may be stenosis at the right PICA origin, limited by
partial coverage. Basilar is smoothly contoured and widely patent.
Fetal type right PCA with robust flow in the bilateral posterior
cerebral arteries.

Anatomic variants: As above

Other: Negative for aneurysm or signs of vascular malformation.
IMPRESSION: 1. No branch occlusion or flow limiting stenosis in the left MCA
circulation.
2. Intracranial atherosclerosis, most notably a severe stenosis or
occlusion of the left vertebral artery beyond the PICA.

## 2021-05-30 IMAGING — CT CT ANGIO HEAD-NECK (W OR W/O PERF)
1 of 11 series · 5 of 33 positions shown · IV contrast (OMNI 350)
Comparison: MRI brain [DATE]. MRA head [DATE].

CLINICAL DATA: Provided history: Stroke/TIA, determine embolic
source.

EXAM:
CT ANGIOGRAPHY HEAD AND NECK
TECHNIQUE: Multidetector CT imaging of the head and neck was performed using
the standard protocol during bolus administration of intravenous
contrast. Multiplanar CT image reconstructions and MIPs were
obtained to evaluate the vascular anatomy. Carotid stenosis
measurements (when applicable) are obtained utilizing NASCET
criteria, using the distal internal carotid diameter as the
denominator.

[Series 11: cta neck axial · axial · 0.49mm/px · z∈[-304,-61]mm · 5 of 365 slices shown]
[im 61/365  soft-tissue]
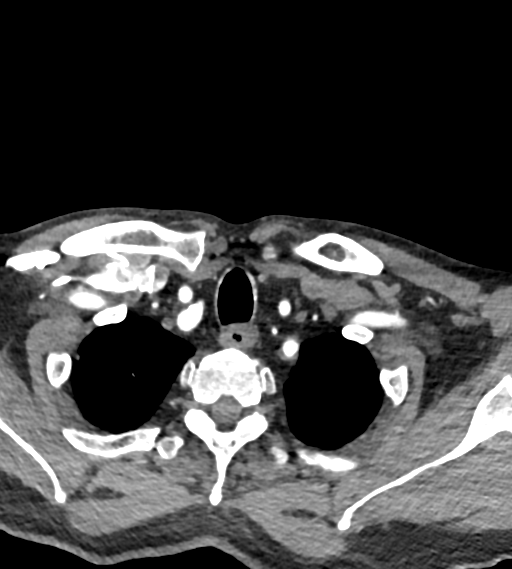
[im 122/365  bone]
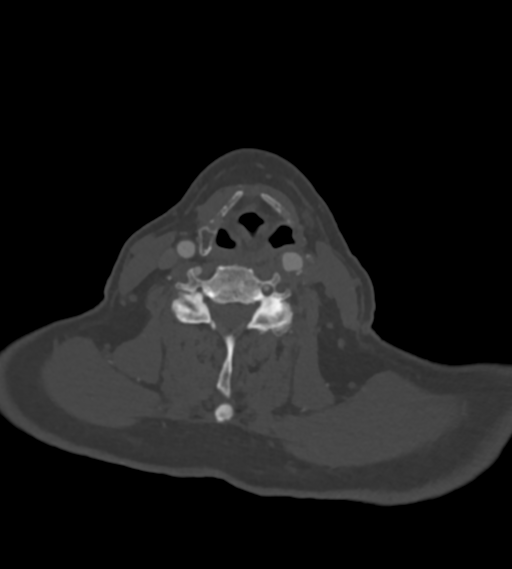
[im 183/365  soft-tissue]
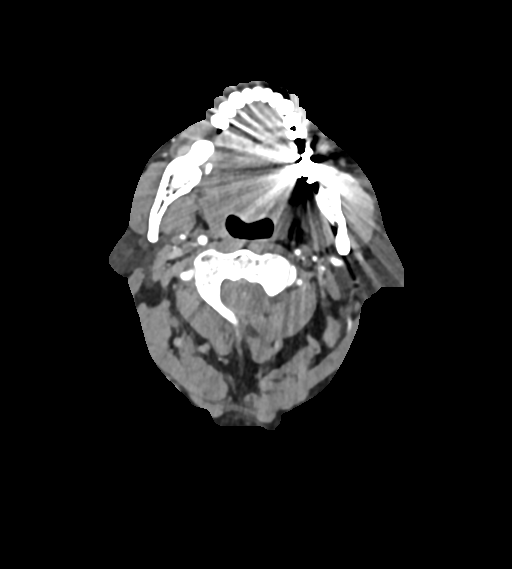
[im 243/365  bone]
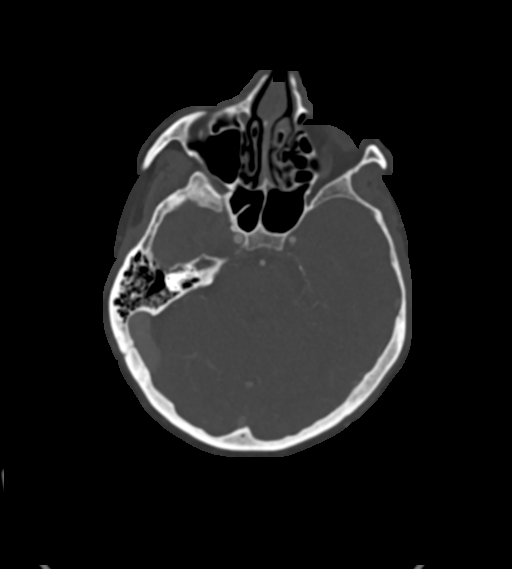
[im 304/365  soft-tissue]
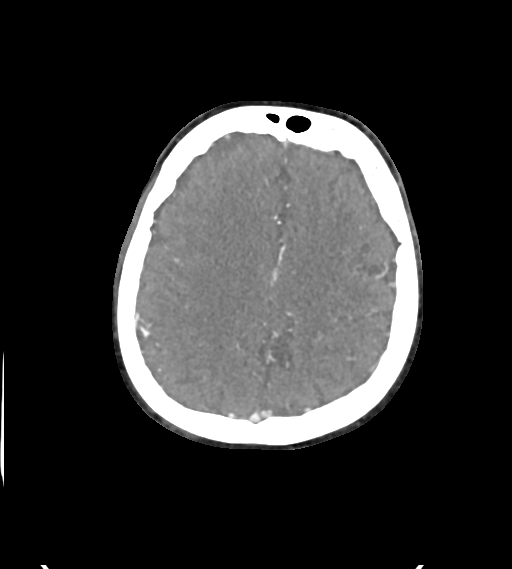

[5 of 33 positions shown; findings below may reference images not displayed]

RADIATION DOSE REDUCTION: This exam was performed according to the
departmental dose-optimization program which includes automated
exposure control, adjustment of the mA and/or kV according to
patient size and/or use of iterative reconstruction technique.

CONTRAST:  75mL OMNIPAQUE IOHEXOL 350 MG/ML SOLN
FINDINGS: CT HEAD FINDINGS

Brain:

No age-advanced or lobar predominant parenchymal atrophy.

Multiple acute/early subacute cortical and subcortical infarcts
within the left frontal, parietal, occipital and posterior temporal
lobes, some of which were better appreciated on the recent prior
brain MRI of [DATE].

Redemonstrated chronic cortical/subcortical infarct within the
posterosuperior left temporal lobe, left temporoparietal junction
and left insula.

Background mild patchy and ill-defined hypoattenuation within the
cerebral white matter, nonspecific but compatible with chronic small
vessel ischemic disease.

There is no acute intracranial hemorrhage.

No extra-axial fluid collection.

No evidence of an intracranial mass.

No midline shift.

Vascular: No hyperdense vessel.  Atherosclerotic calcifications.

Skull: Normal. Negative for fracture or focal lesion.

Sinuses: Minimal mucosal thickening within the bilateral ethmoid
sinuses.

Orbits: No orbital mass or acute orbital finding.

Review of the MIP images confirms the above findings

CTA NECK FINDINGS

Aortic arch: Standard aortic branching. Atherosclerotic plaque
within the visualized aortic arch and proximal major branch vessels
of the neck. No hemodynamically significant innominate or proximal
subclavian artery stenosis.

Right carotid system: CCA and ICA patent within the neck without
hemodynamically significant stenosis (50% or greater). Mild
atherosclerotic plaque within the CCA, about the carotid bifurcation
and within the cervical ICA.

Left carotid system: Prior carotid endarterectomy. CCA and ICA
patent within the neck. Atherosclerotic plaque within the CCA, about
the carotid bifurcation and within the proximal to mid cervical ICA.
Most notably, soft plaque results in severe, near occlusive stenosis
of the proximal ICA (greater than 90%) (series 11, image 217).
Additionally, there are foci of ulcerated plaque within the distal
CCA and carotid bulb.

Vertebral arteries: Vertebral arteries patent within the neck.
Nonstenotic atherosclerotic plaque at the origin of the dominant
right vertebral artery. Soft plaque results in moderate/severe
stenosis at the origin of the non dominant left vertebral artery.

Skeleton: No acute fracture or aggressive osseous lesion. Cervical
spondylosis

Other neck: No neck mass or cervical lymphadenopathy. Subcentimeter
thyroid nodules, not meeting consensus criteria for ultrasound
follow-up based on size.

Upper chest: No consolidation within the imaged lung apices.

Review of the MIP images confirms the above findings

CTA HEAD FINDINGS

Anterior circulation:

The intracranial internal carotid arteries are patent.
Atherosclerotic plaque within both vessels with more than mild
stenosis. The M1 middle cerebral arteries are patent.
Atherosclerotic irregularity of the M2 and more distal MCA vessels,
bilaterally. No M2 proximal branch occlusion or high-grade proximal
stenosis is identified. The anterior cerebral arteries are patent.
The left A1 segment is developmentally hypoplastic.

Posterior circulation:

Dominant intracranial right vertebral artery is patent. The non
dominant intracranial left vertebral artery becomes occluded beyond
the left PICA origin. The basilar artery is patent. The posterior
cerebral arteries are patent. The right PCA is fetal in origin. The
left posterior communicating artery is diminutive or absent.

Venous sinuses: Within the limitations of contrast timing, no
convincing thrombus.

Anatomic variants: As described.

Review of the MIP images confirms the above findings
IMPRESSION: CT head:

1. Multiple acute/early subacute cortical and subcortical infarcts
within the left frontal, parietal, occipital and posterior temporal
lobes, some of which were better appreciated on the recent prior
brain MRI of [DATE].
[DATE]. Redemonstrated chronic cortical/subcortical infarct within the
posterosuperior left temporal lobe, left temporoparietal junction
and left insula.
3. Background mild chronic small-vessel ischemic changes within the
cerebral white matter, stable.

CTA neck:

1. Prior left carotid endarterectomy. Atherosclerotic plaque within
the left carotid system, as described. Most notably, soft plaque
results in a severe, near-occlusive focal stenosis in the proximal
left ICA. Also of note, there are foci of ulcerated plaque within
the distal left CCA and left carotid bulb.
2. Right CCA and ICA patent within the neck without hemodynamically
significant stenosis.
3. Vertebral arteries patent within the neck. Moderate/severe
atherosclerotic stenosis at the origin of the non-dominant left
vertebral artery.

CTA head:

1. The non-dominant intracranial left vertebral artery is occluded
beyond the left PICA origin.
2. Additional intracranial atherosclerotic disease, as described,
without large vessel occlusion or proximal high-grade arterial
stenosis elsewhere.

## 2021-05-30 MED ORDER — MAGNESIUM SULFATE 4 GM/100ML IV SOLN
4.0000 g | Freq: Once | INTRAVENOUS | Status: AC
  Administered 2021-05-30: 4 g via INTRAVENOUS
  Filled 2021-05-30: qty 100

## 2021-05-30 MED ORDER — DIAZEPAM 5 MG PO TABS
5.0000 mg | ORAL_TABLET | Freq: Every evening | ORAL | Status: DC | PRN
  Administered 2021-05-30 – 2021-05-31 (×2): 10 mg via ORAL
  Filled 2021-05-30 (×2): qty 2

## 2021-05-30 MED ORDER — CLOPIDOGREL BISULFATE 75 MG PO TABS: 300.0000 mg | ORAL_TABLET | Freq: Once | ORAL | Status: DC

## 2021-05-30 MED ORDER — IOHEXOL 350 MG/ML SOLN
100.0000 mL | Freq: Once | INTRAVENOUS | Status: AC | PRN
  Administered 2021-05-30: 75 mL via INTRAVENOUS

## 2021-05-30 MED ORDER — ROSUVASTATIN CALCIUM 20 MG PO TABS
20.0000 mg | ORAL_TABLET | Freq: Every day | ORAL | Status: DC
Start: 2021-05-30 — End: 2021-06-01
  Administered 2021-05-30 – 2021-06-01 (×3): 20 mg via ORAL
  Filled 2021-05-30 (×3): qty 1

## 2021-05-30 NOTE — Evaluation (Signed)
Occupational Therapy Evaluation ?Patient Details ?Name: Hunter Fields ?MRN: [id number] ?DOB: 22-Apr-1960 ?Today's Date: 05/30/2021 ? ? ?History of Present Illness pt is a 61 y/o male admitted to ED with Right-sided facial twitching along with right handed numbness and tingling.  MRI shows left side acute to subacute infarcts in the frontal, parietal and occipital lobes and white matter.  EEG normal.  PMHx:DM, HLD, HTN, anxiety  ? ?Clinical Impression ?  ?Pt reports independence at baseline with ADLs and functional mobility, works as a Hotel manager. Lives with spouse who can assist at d/c. Pt currently mod I for ADLs, independent for bed mobility and transfers without AD. Pt scoring a 4 on the SBT indicative of normal cognition, however appearing anxious throughout session, needing increased time to complete tasks and verbal cuing. Pt with decreased coordination and strength in RUE, administered squeeze ball and reviewed coordination exercises with pt. Educated pt on having spouse drive and supervise financial mgmt. Pt verbalized understanding. Pt presenting with impairments listed below, will follow acutely. Recommend OP OT at d/c. ?   ? ?Recommendations for follow up therapy are one component of a multi-disciplinary discharge planning process, led by the attending physician.  Recommendations may be updated based on patient status, additional functional criteria and insurance authorization.  ? ?Follow Up Recommendations ? Outpatient OT  ?  ?Assistance Recommended at Discharge Set up Supervision/Assistance  ?Patient can return home with the following A little help with bathing/dressing/bathroom;Assistance with cooking/housework;Direct supervision/assist for medications management;Direct supervision/assist for financial management;Assist for transportation;Help with stairs or ramp for entrance ? ?  ?Functional Status Assessment ? Patient has had a recent decline in their functional status and demonstrates the ability to make  significant improvements in function in a reasonable and predictable amount of time.  ?Equipment Recommendations ? None recommended by OT  ?  ?Recommendations for Other Services   ? ? ?  ?Precautions / Restrictions Precautions ?Precautions:  (low risk) ?Restrictions ?Weight Bearing Restrictions: No  ? ?  ? ?Mobility Bed Mobility ?Overal bed mobility: Independent ?  ?  ?  ?  ?  ?  ?  ?  ? ?Transfers ?Overall transfer level: Independent ?  ?  ?  ?  ?  ?  ?  ?  ?  ?  ? ?  ?Balance Overall balance assessment: Needs assistance ?  ?Sitting balance-Leahy Scale: Normal ?  ?  ?  ?Standing balance-Leahy Scale: Normal ?  ?  ?  ?  ?  ?  ?  ?  ?  ?  ?  ?  ?   ? ?ADL either performed or assessed with clinical judgement  ? ?ADL Overall ADL's : Needs assistance/impaired ?Eating/Feeding: Modified independent;Sitting ?  ?Grooming: Modified independent;Sitting ?  ?Upper Body Bathing: Modified independent;Sitting ?  ?Lower Body Bathing: Modified independent;Sitting/lateral leans;Sit to/from stand ?  ?Upper Body Dressing : Modified independent;Sitting;Standing ?  ?Lower Body Dressing: Modified independent;Sit to/from stand;Sitting/lateral leans ?Lower Body Dressing Details (indicate cue type and reason): to don/doff socks ?Toilet Transfer: Modified Independent;Ambulation;Regular Toilet ?Toilet Transfer Details (indicate cue type and reason): simulatd in room ?Toileting- Clothing Manipulation and Hygiene: Modified independent;Sit to/from stand;Sitting/lateral lean ?  ?  ?  ?Functional mobility during ADLs: Modified independent ?   ? ? ? ?Vision Baseline Vision/History:  (wears readers) ?Ability to See in Adequate Light: 0 Adequate ?Patient Visual Report: No change from baseline ?Vision Assessment?: No apparent visual deficits ?Additional Comments: reports he had R eye twitching upon arrival, has resolved since admission  ?   ?  Perception   ?  ?Praxis   ?  ? ?Pertinent Vitals/Pain Pain Assessment ?Pain Assessment: No/denies pain   ? ? ? ?Hand Dominance   ?  ?Extremity/Trunk Assessment Upper Extremity Assessment ?Upper Extremity Assessment: RUE deficits/detail ?RUE Deficits / Details: numbness in R hand ?RUE Coordination: decreased fine motor ?  ?Lower Extremity Assessment ?Lower Extremity Assessment: Defer to PT evaluation ?  ?Cervical / Trunk Assessment ?Cervical / Trunk Assessment: Normal ?  ?Communication Communication ?Communication: No difficulties ?  ?Cognition Arousal/Alertness: Awake/alert ?Behavior During Therapy: [facility] for tasks assessed/performed, Anxious ?Overall Cognitive Status: No family/caregiver present to determine baseline cognitive functioning ?  ?  ?  ?  ?  ?  ?  ?  ?  ?  ?  ?  ?  ?  ?  ?  ?General Comments: pt anxious during session, increased time for recall when given SBT ?  ?  ?General Comments  VSS on RA ? ?  ?Exercises   ?  ?Shoulder Instructions    ? ? ?Home Living Family/patient expects to be discharged to:: Private residence ?Living Arrangements: Spouse/significant other ?Available Help at Discharge: Family;Available 24 hours/day ?Type of Home: House ?Home Access: Level entry ?  ?  ?Home Layout: One level ?  ?  ?Bathroom Shower/Tub: Tub/shower unit;Walk-in shower ?  ?Bathroom Toilet: Standard ?Bathroom Accessibility: Yes ?  ?Home Equipment: None;Grab bars - tub/shower ?  ?  ?  ? ?  ?Prior Functioning/Environment Prior Level of Function : Independent/Modified Independent;Driving;Working/employed Conservator, museum/gallery) ?  ?  ?  ?  ?  ?  ?  ?  ?  ? ?  ?  ?OT Problem List: Decreased strength;Decreased range of motion;Decreased cognition;Impaired UE functional use ?  ?   ?OT Treatment/Interventions: Self-care/ADL training;Therapeutic exercise;Neuromuscular education;DME and/or AE instruction;Therapeutic activities;Patient/family education;Balance training;Cognitive remediation/compensation  ?  ?OT Goals(Current goals can be found in the care plan section) Acute Rehab OT Goals ?Patient Stated Goal: to go home ?OT Goal Formulation:  With patient ?Time For Goal Achievement: 06/13/21 ?Potential to Achieve Goals: Good ?ADL Goals ?Pt Will Perform Grooming: with modified independence;standing ?Pt Will Perform Upper Body Dressing: with modified independence;sitting;standing ?Pt Will Perform Lower Body Dressing: with modified independence;sitting/lateral leans;sit to/from stand ?Pt Will Transfer to Toilet: with modified independence;regular height toilet;ambulating ?Additional ADL Goal #1: Pt will follow 2 step command with 100% accuracy in prep for ADLs  ?OT Frequency: Min 3X/week ?  ? ?Co-evaluation   ?  ?  ?  ?  ? ?  ?AM-PAC OT "6 Clicks" Daily Activity     ?Outcome Measure Help from another person eating meals?: None ?Help from another person taking care of personal grooming?: None ?Help from another person toileting, which includes using toliet, bedpan, or urinal?: A Little ?Help from another person bathing (including washing, rinsing, drying)?: A Little ?Help from another person to put on and taking off regular upper body clothing?: A Little ?Help from another person to put on and taking off regular lower body clothing?: A Little ?6 Click Score: 20 ?  ?End of Session Nurse Communication: Mobility status ? ?Activity Tolerance: Patient tolerated treatment well ?Patient left: in bed;with call bell/phone within reach ? ?OT Visit Diagnosis: Muscle weakness (generalized) (M62.81);Other symptoms and signs involving cognitive function  ?              ?Time: 3244-0102 ?OT Time Calculation (min): 25 min ?Charges:  OT General Charges ?$OT Visit: 1 Visit ?OT Evaluation ?$OT Eval Moderate Complexity: 1 Mod ?OT Treatments ?$  Self Care/Home Management : 8-22 mins ? ?Lynnda Child, OTD, OTR/L ?Acute Rehab ?[phone] ? ?Kaylyn Lim ?05/30/2021, 3:01 PM ?

## 2021-05-30 NOTE — Progress Notes (Signed)
[facility] PROGRESS NOTE   ARAEL KOLTERMAN ZOX:[id number] DOB: 1960/07/20 DOA: 05/29/2021  PCP: Pcp, No  Brief History/Interval Summary: 61 y.o. male with a past medical history of diabetes mellitus, hypertension, history of stroke about 7 years ago status post left carotid endarterectomy who was in his usual state of health till about 11:30 on the morning of admission.  He had also noted some numbness in his right hand previously.  Initially there was concern for seizure activity.  Patient was loaded with Keppra.  Subsequently MRI showed acute infarcts.  Neurology is following.   Consultants: Neurology  Procedures: EEG.  Transthoracic echocardiogram.    Subjective/Interval History: Patient mentions that the numbness in the right hand is slightly better today.  No further episodes of right-sided facial twitching.  No other complaints offered.   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  Initially concern for seizure activity.  MRI shows acute infarcts in the left hemisphere.  Patient seen by neurology.  On aspirin and Plavix.  Patient was on aspirin previously. HbA1c is pending Direct LDL is 82.  Triglycerides 782.  Patient on fenofibrate prior to admission.  There is no mention of statin.  Started on Crestor. PT and OT evaluation is pending CT angiogram head and neck ordered by neurology.  Does show evidence for significant disease in the left ICA.  Defer management further work-up to neurology. EEG did not show any epileptiform activity.  Patient was continued on Keppra by neurology.  Hyponatremia Likely due to hypovolemia.  Resolved.  Essential hypertension Blood pressure reasonably well controlled.  Permissive hypertension. He is on lisinopril, losartan-HCTZ prior to admission  Diabetes mellitus type 2, uncontrolled with hyperglycemia It appears the patient is on glyburide  and metformin prior to admission.  Continue SSI.  HbA1c is pending.  Hyperlipidemia See above.  On Crestor currently.  Was on fenofibrate prior to admission.  Previous history of stroke This was approximately 7 years ago when he was in [facility].  He is status post left carotid endarterectomy  Chronic kidney disease stage II Renal function at baseline.  Continue to monitor.  Avoid nephrotoxic agents.  Tobacco abuse Counseled.  DVT Prophylaxis: Lovenox Code Status: Full code Family Communication: Discussed with patient Disposition Plan: Hopefully return home when evaluation has been completed  Status is: Observation The patient remains OBS appropriate and might d/c before 2 midnights.    Medications: Scheduled:  aspirin EC  81 mg Oral Daily   chlorhexidine gluconate (MEDLINE KIT)  15 mL Mouth Rinse BID   clopidogrel  75 mg Oral Daily   enoxaparin (LOVENOX) injection  40 mg Subcutaneous Q24H   insulin aspart  0-15 Units Subcutaneous TID WC   insulin aspart  0-5 Units Subcutaneous QHS   mouth rinse  15 mL Mouth Rinse 10 times per day   rosuvastatin  20 mg Oral Daily   Continuous:  sodium chloride 75 mL/hr at 05/29/21 1839   levETIRAcetam 500 mg (05/30/21 1018)   WJX:BJYNWGNFAOZHY **OR** acetaminophen, ondansetron **OR** ondansetron (ZOFRAN) IV  Antibiotics: Anti-infectives (From admission, onward)    None       Objective:  Vital Signs  Vitals:   05/29/21 2300 05/29/21 2318 05/30/21 0640 05/30/21 0705  BP:  129/86 131/82 128/88  Pulse:  77 80 87  Resp:  17 15 19   Temp:  98 F (36.7 C) 97.9 F (36.6 C) 97.8 F (36.6  C)  TempSrc:  Oral Oral Oral  SpO2:  99% 97% 99%  Weight: 71.3 kg     Height: 5\' 8"  (1.727 m)       Intake/Output Summary (Last 24 hours) at 05/30/2021 1216 Last data filed at 05/30/2021 0641 Gross per 24 hour  Intake 225 ml  Output 1900 ml  Net -1675 ml   Filed Weights   05/29/21 2300  Weight: 71.3 kg    General  appearance: Awake alert.  In no distress Resp: Clear to auscultation bilaterally.  Normal effort Cardio: S1-S2 is normal regular.  No S3-S4.  No rubs murmurs or bruit GI: Abdomen is soft.  Nontender nondistended.  Bowel sounds are present normal.  No masses organomegaly Extremities: No edema.  Full range of motion of lower extremities. Neurologic: Alert and oriented x3.  Finger to nose on the right is not as accurate as left.   Lab Results:  Data Reviewed: I have personally reviewed following labs and reports of the imaging studies  CBC: Recent Labs  Lab 05/29/21 1246 05/29/21 2358  WBC 6.5 6.2  NEUTROABS 4.8  --   HGB 13.5 14.5  HCT 40.5 42.7  MCV 87.5 87.0  PLT 278 302    Basic Metabolic Panel: Recent Labs  Lab 05/29/21 1246 05/29/21 2358  NA 130* 138  K 4.4 3.8  CL 102 103  CO2 20* 24  GLUCOSE 392* 88  BUN 33* 26*  CREATININE 1.48* 1.33*  CALCIUM 8.9 9.3  MG  --  1.6*    GFR: Estimated Creatinine Clearance: 56.4 mL/min (A) (by C-G formula based on SCr of 1.33 mg/dL (H)).  Liver Function Tests: Recent Labs  Lab 05/29/21 2358  AST 17  ALT 18  ALKPHOS 72  BILITOT 0.3  PROT 6.7  ALBUMIN 3.5     Cardiac Enzymes: Recent Labs  Lab 05/29/21 2358  CKTOTAL 96    CBG: Recent Labs  Lab 05/29/21 1239 05/29/21 1347 05/29/21 1651 05/29/21 2150 05/30/21 0638  GLUCAP 430* 396* 257* 241* 194*    Lipid Profile: Recent Labs    05/29/21 2358  CHOL 249*  HDL 30*  LDLCALC UNABLE TO CALCULATE IF TRIGLYCERIDE OVER 400 mg/dL  TRIG 098*  CHOLHDL 8.3  LDLDIRECT 82.0    Thyroid Function Tests: Recent Labs    05/29/21 2358  TSH 4.507*     Radiology Studies: CT ANGIO HEAD NECK W WO CM  Result Date: 05/30/2021 CLINICAL DATA:  Provided history: Stroke/TIA, determine embolic source. EXAM: CT ANGIOGRAPHY HEAD AND NECK TECHNIQUE: Multidetector CT imaging of the head and neck was performed using the standard protocol during bolus administration of  intravenous contrast. Multiplanar CT image reconstructions and MIPs were obtained to evaluate the vascular anatomy. Carotid stenosis measurements (when applicable) are obtained utilizing NASCET criteria, using the distal internal carotid diameter as the denominator. RADIATION DOSE REDUCTION: This exam was performed according to the departmental dose-optimization program which includes automated exposure control, adjustment of the mA and/or kV according to patient size and/or use of iterative reconstruction technique. CONTRAST:  75mL OMNIPAQUE IOHEXOL 350 MG/ML SOLN COMPARISON:  MRI brain 05/29/2021. MRA head 05/30/2021. FINDINGS: CT HEAD FINDINGS Brain: No age-advanced or lobar predominant parenchymal atrophy. Multiple acute/early subacute cortical and subcortical infarcts within the left frontal, parietal, occipital and posterior temporal lobes, some of which were better appreciated on the recent prior brain MRI of 05/29/2021. Redemonstrated chronic cortical/subcortical infarct within the posterosuperior left temporal lobe, left temporoparietal junction and left insula. Background mild patchy  and ill-defined hypoattenuation within the cerebral white matter, nonspecific but compatible with chronic small vessel ischemic disease. There is no acute intracranial hemorrhage. No extra-axial fluid collection. No evidence of an intracranial mass. No midline shift. Vascular: No hyperdense vessel.  Atherosclerotic calcifications. Skull: Normal. Negative for fracture or focal lesion. Sinuses: Minimal mucosal thickening within the bilateral ethmoid sinuses. Orbits: No orbital mass or acute orbital finding. Review of the MIP images confirms the above findings CTA NECK FINDINGS Aortic arch: Standard aortic branching. Atherosclerotic plaque within the visualized aortic arch and proximal major branch vessels of the neck. No hemodynamically significant innominate or proximal subclavian artery stenosis. Right carotid system: CCA and  ICA patent within the neck without hemodynamically significant stenosis (50% or greater). Mild atherosclerotic plaque within the CCA, about the carotid bifurcation and within the cervical ICA. Left carotid system: Prior carotid endarterectomy. CCA and ICA patent within the neck. Atherosclerotic plaque within the CCA, about the carotid bifurcation and within the proximal to mid cervical ICA. Most notably, soft plaque results in severe, near occlusive stenosis of the proximal ICA (greater than 90%) (series 11, image 217). Additionally, there are foci of ulcerated plaque within the distal CCA and carotid bulb. Vertebral arteries: Vertebral arteries patent within the neck. Nonstenotic atherosclerotic plaque at the origin of the dominant right vertebral artery. Soft plaque results in moderate/severe stenosis at the origin of the non dominant left vertebral artery. Skeleton: No acute fracture or aggressive osseous lesion. Cervical spondylosis Other neck: No neck mass or cervical lymphadenopathy. Subcentimeter thyroid nodules, not meeting consensus criteria for ultrasound follow-up based on size. Upper chest: No consolidation within the imaged lung apices. Review of the MIP images confirms the above findings CTA HEAD FINDINGS Anterior circulation: The intracranial internal carotid arteries are patent. Atherosclerotic plaque within both vessels with more than mild stenosis. The M1 middle cerebral arteries are patent. Atherosclerotic irregularity of the M2 and more distal MCA vessels, bilaterally. No M2 proximal branch occlusion or high-grade proximal stenosis is identified. The anterior cerebral arteries are patent. 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 Posterior circulation: Dominant intracranial right vertebral artery is patent. The non dominant intracranial left vertebral artery becomes occluded beyond the left PICA origin. The basilar artery is patent. The posterior cerebral arteries are patent. The right  PCA is fetal in origin. The left posterior communicating artery is diminutive or absent. Venous sinuses: Within the limitations of contrast timing, no convincing thrombus. Anatomic variants: As described. Review of the MIP images confirms the above findings IMPRESSION: CT head: 1. Multiple acute/early subacute cortical and subcortical infarcts within the left frontal, parietal, occipital and posterior temporal lobes, some of which were better appreciated on the recent prior brain MRI of 05/29/2021. 2. Redemonstrated chronic cortical/subcortical infarct within the posterosuperior left temporal lobe, left temporoparietal junction and left insula. 3. Background mild chronic small-vessel ischemic changes within the cerebral white matter, stable. CTA neck: 1. Prior left carotid endarterectomy. Atherosclerotic plaque within the left carotid system, as described. Most notably, soft plaque results in a severe, near-occlusive focal stenosis in the proximal left ICA. Also of note, there are foci of ulcerated plaque within the distal left CCA and left carotid bulb. 2. Right CCA and ICA patent within the neck without hemodynamically significant stenosis. 3. Vertebral arteries patent within the neck. Moderate/severe atherosclerotic stenosis at the origin of the non-dominant left vertebral artery. CTA head: 1. The non-dominant intracranial left vertebral artery is occluded beyond the left PICA origin. 2. Additional intracranial atherosclerotic disease, as  described, without large vessel occlusion or proximal high-grade arterial stenosis elsewhere. Electronically Signed   By: Jackey Loge D.O.   On: 05/30/2021 09:44   DG Chest 2 View  Result Date: 05/29/2021 CLINICAL DATA:  Seizure. EXAM: CHEST - 2 VIEW COMPARISON:  Remote exam 06/26/2003 FINDINGS: Upper normal heart size.The cardiomediastinal contours are normal. Aortic atherosclerosis. Pulmonary vasculature is normal. No consolidation, pleural effusion, or pneumothorax.  Thoracic spondylosis. No acute osseous abnormalities are seen. No planted medical device or metallic foreign body. There are surgical clips at the thoracic inlet. IMPRESSION: No acute chest findings. Electronically Signed   By: Narda Rutherford M.D.   On: 05/29/2021 18:14   MR ANGIO HEAD WO CONTRAST  Result Date: 05/30/2021 CLINICAL DATA:  Right-sided facial twitching.  Right hand numbness. EXAM: MRA HEAD WITHOUT CONTRAST TECHNIQUE: Angiographic images of the Circle of Willis were acquired using MRA technique without intravenous contrast. COMPARISON:  Brain MRI from yesterday FINDINGS: Anterior circulation: Atheromatous irregularity of the carotid siphons. Of the left ICA is smaller than the right, likely due to circle-of-Willis anatomy with fetal type right PCA and hypoplastic left A1 segment. Atheromatous irregularity of the left A1 segment and bilateral MCA branches. No major branch occlusion or proximal flow limiting stenosis. Posterior circulation: Absent distal left vertebral artery beyond the PICA. There may be stenosis at the right PICA origin, limited by partial coverage. Basilar is smoothly contoured and widely patent. Fetal type right PCA with robust flow in the bilateral posterior cerebral arteries. Anatomic variants: As above Other: Negative for aneurysm or signs of vascular malformation. IMPRESSION: 1. No branch occlusion or flow limiting stenosis in the left MCA circulation. 2. Intracranial atherosclerosis, most notably a severe stenosis or occlusion of the left vertebral artery beyond the PICA. Electronically Signed   By: Tiburcio Pea M.D.   On: 05/30/2021 04:10   MR BRAIN W WO CONTRAST  Result Date: 05/29/2021 CLINICAL DATA:  Seizure right facial twitching EXAM: MRI HEAD WITHOUT AND WITH CONTRAST TECHNIQUE: Multiplanar, multiecho pulse sequences of the brain and surrounding structures were obtained without and with intravenous contrast. CONTRAST:  8.26mL GADAVIST GADOBUTROL 1 MMOL/ML IV  SOLN COMPARISON:  None. FINDINGS: Brain: Restricted diffusion in the left cerebral hemisphere, most prominently in the left occipital cortex (series 2, images 21, 24-27) And left frontal cortex (series 2, images 37-43), with additional punctate cortical foci in the left parietal lobe (series 2, image 40 and 35) and additional scattered white matter foci (series 2, image 32-36). Some enhancement is noted in the left frontal cortex (series 12, image 115-119), which correlates with the area of restricted diffusion in the left frontal lobe, suggesting that these infarcts are in part subacute. No acute hemorrhage, mass, mass effect, or midline shift. No hydrocephalus or extra-axial collection. No additional abnormal parenchymal or meningeal enhancement. Encephalomalacia in the left temporal lobe, sequela of remote infarct. Vascular: Loss of the distal left vertebral artery flow void (series 4, image 5), with poor opacification in this location on postcontrast imaging (series 12, image 21). Otherwise normal flow voids. Venous sinuses are patent on postcontrast imaging. A patent left posterior communicating artery is suspected on postcontrast imaging (series 12, image 57). Skull and upper cervical spine: Normal marrow signal. Sinuses/Orbits: Clear paranasal sinuses. Status post bilateral lens replacements. Other: Trace fluid in the right mastoid air cells. IMPRESSION: 1. Acute and/or subacute infarcts in the left frontal, occipital, and parietal cortex and white matter. Contrast enhancement associated with some of the left frontal cortical infarcts  suggest that these are subacute. No enhancement is associated with the remainder of the infarcts, which are likely acute. 2. Loss of the distal left vertebral artery flow void, with poor opacification on postcontrast imaging, concerning for severe stenosis. These results were called by telephone at the time of interpretation on 05/29/2021 at 9:43 pm to provider DR Imogene Burn , who  verbally acknowledged these results. Electronically Signed   By: Wiliam Ke M.D.   On: 05/29/2021 21:43   EEG adult  Result Date: 05/29/2021 Jefferson Fuel, MD     05/29/2021  8:31 PM Routine EEG Report JHAN WALBECK is a 60 y.o. male with a history of spells who is undergoing an EEG to evaluate for seizures. Report: This EEG was acquired with electrodes placed according to the International 10-20 electrode system (including Fp1, Fp2, F3, F4, C3, C4, P3, P4, O1, O2, T3, T4, T5, T6, A1, A2, Fz, Cz, Pz). The following electrodes were missing or displaced: none. The occipital dominant rhythm was 9 Hz. This activity is reactive to stimulation. Drowsiness was manifested by background fragmentation; deeper stages of sleep were not identified. There was no focal slowing. There were no interictal epileptiform discharges. There were no electrographic seizures identified. There was no abnormal response to photic stimulation or hyperventilation. Impression: This EEG was obtained while awake and drowsy and is normal.   Clinical Correlation: Normal EEGs, however, do not rule out epilepsy. Bing Neighbors, MD [facility] [phone] If 7pm- 7am, please page neurology on call as listed in AMION.       LOS: 0 days   Marquasia Schmieder Rito Ehrlich  [facility] Pager on [email or web address]  05/30/2021, 12:16 PM

## 2021-05-30 NOTE — TOC Initial Note (Signed)
Transition of Care (TOC) - Initial/Assessment NoteT   ? ? ?Patient Details  ?Name: Hunter Fields ?MRN: [id number] ?Date of Birth: [birth year] ? ?Transition of Care (TOC) CM/SW Contact:    ?Pollie Friar, RN ?Phone Number: ?05/30/2021, 10:33 AM ? ?Clinical Narrative:                 ?Patient is from home with spouse. Pt works during the daytime. No DME at home.  ?Pt drives self. Patient was on medications prior to admission and denies issues with home medications.  ?Pts insurance through work doesn't start until July 30, 2021. He is interested in getting into one of the [facility] until his insurance starts. CM has obtained an appointment with [facility] and placed information on the AVS.  ?CM following for medication assistance at d/c and for PT/OT recommendations. ?Pt has transport home once discharged.  ? ?Expected Discharge Plan: Home/Self Care ?Barriers to Discharge: Continued Medical Work up, Inadequate or no insurance ? ? ?Patient Goals and CMS Choice ?  ?  ?  ? ?Expected Discharge Plan and Services ?Expected Discharge Plan: Home/Self Care ?  ?Discharge Planning Services: CM Consult ?  ?Living arrangements for the past 2 months: [place] ?                ?  ?  ?  ?  ?  ?  ?  ?  ?  ?  ? ?Prior Living Arrangements/Services ?Living arrangements for the past 2 months: [place] ?Lives with:: Spouse ?Patient language and need for interpreter reviewed:: Yes ?Do you feel safe going back to the place where you live?: Yes      ?  ?Care giver support system in place?: Yes (comment) ?  ?Criminal Activity/Legal Involvement Pertinent to Current Situation/Hospitalization: No - Comment as needed ? ?Activities of Daily Living ?Home Assistive Devices/Equipment: None ?ADL Screening (condition at time of admission) ?Patient's cognitive ability adequate to safely complete daily activities?: Yes ?Is the patient deaf or have difficulty hearing?: No ?Does the patient have difficulty seeing, even when wearing  glasses/contacts?: No ?Does the patient have difficulty concentrating, remembering, or making decisions?: No ?Patient able to express need for assistance with ADLs?: No ?Does the patient have difficulty dressing or bathing?: No ?Independently performs ADLs?: Yes (appropriate for developmental age) ?Does the patient have difficulty walking or climbing stairs?: No ?Weakness of Legs: None ?Weakness of Arms/Hands: Right ? ?Permission Sought/Granted ?  ?  ?   ?   ?   ?   ? ?Emotional Assessment ?Appearance:: Appears stated age ?Attitude/Demeanor/Rapport: Engaged ?Affect (typically observed): Accepting ?Orientation: : Oriented to Self, Oriented to Place, Oriented to  Time, Oriented to Situation ?  ?Psych Involvement: No (comment) ? ?Admission diagnosis:  Hyponatremia [E87.1] ?Hyperglycemia [R73.9] ?Facial twitching [G51.4] ?Seizure-like activity ([place]) [R56.9] ?Patient Active Problem List  ? Diagnosis Date Noted  ? Facial twitching 05/29/2021  ? Numbness and tingling in right hand 05/29/2021  ? Hyponatremia 05/29/2021  ? CKD (chronic kidney disease) stage 2, GFR 60-89 ml/min 05/29/2021  ? Cerebral embolism with cerebral infarction 05/29/2021  ? Essential tremor 04/02/2014  ? Medication management 05/18/2013  ? Vitamin D deficiency 05/18/2013  ? T2_NIDDM with Stage 2 CKD (GFR 81 ml/min) 05/17/2013  ? Hyperlipidemia   ? Hypertension   ? Hypothyroid   ? Anxiety 09/23/2010  ? Testosterone deficiency   ? ?PCP:  Pcp, No ?Pharmacy:   ?[id number] [place] ?[place] [address] ?Phone: [phone] Fax: [phone] ? ? ? ? ?Social Determinants of Health (SDOH) Interventions ?  ? ?Readmission Risk Interventions ?   ? View : No data to display.  ?  ?  ?  ? ? ? ?

## 2021-05-30 NOTE — Progress Notes (Signed)
SLP Cancellation Note ? ?Patient Details ?Name: Hunter Fields ?MRN: [id number] ?DOB: August 06, 1960 ? ? ?Cancelled treatment:       Reason Eval/Treat Not Completed: SLP screened, no needs identified, will sign off; symptoms have resolved; pt denies any speech/language or cognitive decline.  MRI 05/30/21 negative for acute processes.  ST will s/o in acute setting. ? ? ?Elvina Sidle, M.S., CCC-SLP ?05/30/2021, 12:41 PM ?

## 2021-05-30 NOTE — Progress Notes (Addendum)
STROKE TEAM PROGRESS NOTE  ? ?INTERVAL HISTORY ?No family is at the bedside.  Patient lying in bed, still has left hand weakness.  CT head and neck showed history of left carotid endarterectomy, however now left ICA string sign superior to the previous endarterectomy location.  Interventional radiology consulted. ? ?Vitals:  ? 05/29/21 2300 05/29/21 2318 05/30/21 0640 05/30/21 0705  ?BP:  129/86 131/82 128/88  ?Pulse:  77 80 87  ?Resp:  '17 15 19  '$ ?Temp:  98 ?F (36.7 ?C) 97.9 ?F (36.6 ?C) 97.8 ?F (36.6 ?C)  ?TempSrc:  Oral Oral Oral  ?SpO2:  99% 97% 99%  ?Weight: 71.3 kg     ?Height: '5\' 8"'$  (1.727 m)     ? ?CBC:  ?Recent Labs  ?Lab 05/29/21 ?1246 05/29/21 ?2358  ?WBC 6.5 6.2  ?NEUTROABS 4.8  --   ?HGB 13.5 14.5  ?HCT 40.5 42.7  ?MCV 87.5 87.0  ?PLT 278 302  ? ?Basic Metabolic Panel:  ?Recent Labs  ?Lab 05/29/21 ?1246 05/29/21 ?2358  ?NA 130* 138  ?K 4.4 3.8  ?CL 102 103  ?CO2 20* 24  ?GLUCOSE 392* 88  ?BUN 33* 26*  ?CREATININE 1.48* 1.33*  ?CALCIUM 8.9 9.3  ?MG  --  1.6*  ? ?Lipid Panel:  ?Recent Labs  ?Lab 05/29/21 ?2358  ?CHOL 249*  ?TRIG 782*  ?HDL 30*  ?CHOLHDL 8.3  ?VLDL UNABLE TO CALCULATE IF TRIGLYCERIDE OVER 400 mg/dL  ?LDLCALC UNABLE TO CALCULATE IF TRIGLYCERIDE OVER 400 mg/dL  ? ?HgbA1c: No results for input(s): HGBA1C in the last 168 hours. ? ?IMAGING past 24 hours ?DG Chest 2 View ? ?Result Date: 05/29/2021 ?CLINICAL DATA:  Seizure. EXAM: CHEST - 2 VIEW COMPARISON:  Remote exam 06/26/2003 FINDINGS: Upper normal heart size.The cardiomediastinal contours are normal. Aortic atherosclerosis. Pulmonary vasculature is normal. No consolidation, pleural effusion, or pneumothorax. Thoracic spondylosis. No acute osseous abnormalities are seen. No planted medical device or metallic foreign body. There are surgical clips at the thoracic inlet. IMPRESSION: No acute chest findings. Electronically Signed   By: Keith Rake M.D.   On: 05/29/2021 18:14  ? ?MR ANGIO HEAD WO CONTRAST ? ?Result Date: 05/30/2021 ?CLINICAL  DATA:  Right-sided facial twitching.  Right hand numbness. EXAM: MRA HEAD WITHOUT CONTRAST TECHNIQUE: Angiographic images of the Circle of Willis were acquired using MRA technique without intravenous contrast. COMPARISON:  Brain MRI from yesterday FINDINGS: Anterior circulation: Atheromatous irregularity of the carotid siphons. Of the left ICA is smaller than the right, likely due to circle-of-Willis anatomy with fetal type right PCA and hypoplastic left A1 segment. Atheromatous irregularity of the left A1 segment and bilateral MCA branches. No major branch occlusion or proximal flow limiting stenosis. Posterior circulation: Absent distal left vertebral artery beyond the PICA. There may be stenosis at the right PICA origin, limited by partial coverage. Basilar is smoothly contoured and widely patent. Fetal type right PCA with robust flow in the bilateral posterior cerebral arteries. Anatomic variants: As above Other: Negative for aneurysm or signs of vascular malformation. IMPRESSION: 1. No branch occlusion or flow limiting stenosis in the left MCA circulation. 2. Intracranial atherosclerosis, most notably a severe stenosis or occlusion of the left vertebral artery beyond the PICA. Electronically Signed   By: Jorje Guild M.D.   On: 05/30/2021 04:10  ? ?MR BRAIN W WO CONTRAST ? ?Result Date: 05/29/2021 ?CLINICAL DATA:  Seizure right facial twitching EXAM: MRI HEAD WITHOUT AND WITH CONTRAST TECHNIQUE: Multiplanar, multiecho pulse sequences of the brain and surrounding  structures were obtained without and with intravenous contrast. CONTRAST:  8.56m GADAVIST GADOBUTROL 1 MMOL/ML IV SOLN COMPARISON:  None. FINDINGS: Brain: Restricted diffusion in the left cerebral hemisphere, most prominently in the left occipital cortex (series 2, images 21, 24-27) And left frontal cortex (series 2, images 37-43), with additional punctate cortical foci in the left parietal lobe (series 2, image 40 and 35) and additional scattered  white matter foci (series 2, image 32-36). Some enhancement is noted in the left frontal cortex (series 12, image 115-119), which correlates with the area of restricted diffusion in the left frontal lobe, suggesting that these infarcts are in part subacute. No acute hemorrhage, mass, mass effect, or midline shift. No hydrocephalus or extra-axial collection. No additional abnormal parenchymal or meningeal enhancement. Encephalomalacia in the left temporal lobe, sequela of remote infarct. Vascular: Loss of the distal left vertebral artery flow void (series 4, image 5), with poor opacification in this location on postcontrast imaging (series 12, image 21). Otherwise normal flow voids. Venous sinuses are patent on postcontrast imaging. A patent left posterior communicating artery is suspected on postcontrast imaging (series 12, image 57). Skull and upper cervical spine: Normal marrow signal. Sinuses/Orbits: Clear paranasal sinuses. Status post bilateral lens replacements. Other: Trace fluid in the right mastoid air cells. IMPRESSION: 1. Acute and/or subacute infarcts in the left frontal, occipital, and parietal cortex and white matter. Contrast enhancement associated with some of the left frontal cortical infarcts suggest that these are subacute. No enhancement is associated with the remainder of the infarcts, which are likely acute. 2. Loss of the distal left vertebral artery flow void, with poor opacification on postcontrast imaging, concerning for severe stenosis. These results were called by telephone at the time of interpretation on 05/29/2021 at 9:43 pm to provider DR CBridgett Larsson, who verbally acknowledged these results. Electronically Signed   By: AMerilyn BabaM.D.   On: 05/29/2021 21:43  ? ?EEG adult ? ?Result Date: 05/29/2021 ?SDerek Jack MD     05/29/2021  8:31 PM Routine EEG Report RCESARIO WEIDINGERis a 61y.o. male with a history of spells who is undergoing an EEG to evaluate for seizures. Report: This EEG was  acquired with electrodes placed according to the International 10-20 electrode system (including Fp1, Fp2, F3, F4, C3, C4, P3, P4, O1, O2, T3, T4, T5, T6, A1, A2, Fz, Cz, Pz). The following electrodes were missing or displaced: none. The occipital dominant rhythm was 9 Hz. This activity is reactive to stimulation. Drowsiness was manifested by background fragmentation; deeper stages of sleep were not identified. There was no focal slowing. There were no interictal epileptiform discharges. There were no electrographic seizures identified. There was no abnormal response to photic stimulation or hyperventilation. Impression: This EEG was obtained while awake and drowsy and is normal.   Clinical Correlation: Normal EEGs, however, do not rule out epilepsy. CSu Monks MD [facility] [phone] 7pm- 7am, please page neurology on call as [place]   ? ?PHYSICAL EXAM ? ?Physical Exam  ?Constitutional: Appears well-developed and well-nourished.  ?Cardiovascular: Normal rate and regular rhythm.  ?Respiratory: Effort normal, non-labored breathing ? ?Neuro: ?Mental Status: ?Patient is awake, alert, oriented to person, place, month, year, and situation. ?Patient is able to give a clear and coherent history. ?No signs of aphasia or neglect ?Cranial Nerves: ?II: Visual Fields are full. Pupils are equal, round, and reactive to light.   ?III,IV, VI: EOMI without ptosis or diploplia.  ?V: Facial sensation is symmetric  to temperature ?VII: Facial movement is symmetric resting and smiling ?VIII: Hearing is intact to voice ?X: Palate elevates symmetrically ?XI: Shoulder shrug is symmetric. ?XII: Tongue protrudes midline without atrophy or fasciculations.  ?Motor: ?Tone is normal. Bulk is normal. 5/5 strength was present in all four extremities except left hand grip 4/5, decreased dexterity.  ?Sensory: ?Sensation is symmetric to light touch and temperature in the arms and legs. No extinction to DSS present.   ?Deep Tendon Reflexes: ?2+ and symmetric in the biceps and patellae.  ?Plantars: ?Toes are downgoing bilaterally.  ?Cerebellar: ?Left FNF and bilateral HKS are intact bilaterally.  Right finger-to-nose ataxic.

## 2021-05-30 NOTE — Evaluation (Signed)
Physical Therapy Evaluation ?Patient Details ?Name: Hunter Fields ?MRN: [id number] ?DOB: 1960/06/30 ?Today's Date: 05/30/2021 ? ?History of Present Illness ? pt is a 61 y/o male admitted to ED withRight-sided facial twitching along with right handed numbness and tingling.  MRI shows left side acute to subacute infarcts in the frontal, parietal and occipital lobes and white matter.  EEG normal.  PMHx:DM, HLD, HTN, anxiety  ?Clinical Impression ? Pt is at or close to baseline functioning and should be safe at home . There are no further acute PT needs.  Will sign off at this time. ?   ?   ? ?Recommendations for follow up therapy are one component of a multi-disciplinary discharge planning process, led by the attending physician.  Recommendations may be updated based on patient status, additional functional criteria and insurance authorization. ? ?Follow Up Recommendations No PT follow up ? ?  ?Assistance Recommended at Discharge None  ?Patient can return home with the following ?   ? ?  ?Equipment Recommendations None recommended by PT  ?Recommendations for Other Services ?    ?  ?Functional Status Assessment Patient has had a recent decline in their functional status and demonstrates the ability to make significant improvements in function in a reasonable and predictable amount of time.  ? ?  ?Precautions / Restrictions Precautions ?Precautions:  (low risk)  ? ?  ? ?Mobility ? Bed Mobility ?Overal bed mobility: Independent ?  ?  ?  ?  ?  ?  ?  ?  ? ?Transfers ?Overall transfer level: Independent ?  ?  ?  ?  ?  ?  ?  ?  ?  ?  ? ?Ambulation/Gait ?Ambulation/Gait assistance: Independent ?Gait Distance (Feet): 500 Feet ?Assistive device: None ?Gait Pattern/deviations: Step-through pattern ?  ?Gait velocity interpretation: >4.37 ft/sec, indicative of normal walking speed ?  ?General Gait Details: functional and steady,  age appropriate gait speeds, no deviation except pt did not consistently execute my rapidly-fired  commands, going left instead of R, asking how many steps to do after command to go up/down 3 steps, etc. ? ?Stairs ?Stairs: Yes ?Stairs assistance: Modified independent (Device/Increase time), Independent ?Stair Management: No rails, One rail Right, Alternating pattern, Forwards ?Number of Stairs: 6 ?  ? ?Wheelchair Mobility ?  ? ?Modified Rankin (Stroke Patients Only) ?Modified Rankin (Stroke Patients Only) ?Pre-Morbid Rankin Score: No symptoms ?Modified Rankin: Slight disability ? ?  ? ?Balance Overall balance assessment: Needs assistance ?  ?Sitting balance-Leahy Scale: Normal ?  ?  ?  ?Standing balance-Leahy Scale: Normal ?  ?  ?  ?  ?  ?  ?  ?  ?  ?Standardized Balance Assessment ?Standardized Balance Assessment : Dynamic Gait Index ?  ?Dynamic Gait Index ?Level Surface: Normal ?Change in Gait Speed: Normal ?Gait with Horizontal Head Turns: Normal ?Gait with Vertical Head Turns: Normal ?Gait and Pivot Turn: Normal ?Step Over Obstacle: Normal ?Step Around Obstacles: Normal ?Steps: Normal ?Total Score: 24 ?   ? ? ? ?Pertinent Vitals/Pain Pain Assessment ?Pain Assessment: No/denies pain  ? ? ?Home Living Family/patient expects to be discharged to:: Private residence ?Living Arrangements: Spouse/significant other ?Available Help at Discharge: Family;Available 24 hours/day ?Type of Home: House ?Home Access: Level entry ?  ?  ?  ?Home Layout: One level ?Home Equipment: None;Grab bars - tub/shower ?   ?  ?Prior Function Prior Level of Function : Independent/Modified Independent;Driving;Working/employed Conservator, museum/gallery) ?  ?  ?  ?  ?  ?  ?  ?  ?  ? ? ?  Hand Dominance  ?   ? ?  ?Extremity/Trunk Assessment  ? Upper Extremity Assessment ?Upper Extremity Assessment: Defer to OT evaluation (functional strength) ?  ? ?Lower Extremity Assessment ?Lower Extremity Assessment: Overall WFL for tasks assessed ?  ? ?Cervical / Trunk Assessment ?Cervical / Trunk Assessment: Normal  ?Communication  ? Communication: No difficulties   ?Cognition Arousal/Alertness: Awake/alert ?Behavior During Therapy: [facility] for tasks assessed/performed ?Overall Cognitive Status:  (oriented x4,  made mistakes with rapid-fire commands during DGI) ?  ?  ?  ?  ?  ?  ?  ?  ?  ?  ?  ?  ?  ?  ?  ?  ?  ?  ?  ? ?  ?General Comments   ? ?  ?Exercises    ? ?Assessment/Plan  ?  ?PT Assessment Patient does not need any further PT services  ?PT Problem List   ? ?   ?  ?PT Treatment Interventions     ? ?PT Goals (Current goals can be found in the Care Plan section)  ?Acute Rehab PT Goals ?Patient Stated Goal: home today ?PT Goal Formulation: All assessment and education complete, DC therapy ? ?  ?Frequency   ?  ? ? ?Co-evaluation   ?  ?  ?  ?  ? ? ?  ?AM-PAC PT "6 Clicks" Mobility  ?Outcome Measure Help needed turning from your back to your side while in a flat bed without using bedrails?: None ?Help needed moving from lying on your back to sitting on the side of a flat bed without using bedrails?: None ?Help needed moving to and from a bed to a chair (including a wheelchair)?: None ?Help needed standing up from a chair using your arms (e.g., wheelchair or bedside chair)?: None ?Help needed to walk in hospital room?: None ?Help needed climbing 3-5 steps with a railing? : None ?6 Click Score: 24 ? ?  ?End of Session   ?Activity Tolerance: Patient tolerated treatment well ?Patient left: in bed;with call bell/phone within reach ?Nurse Communication: Mobility status ?PT Visit Diagnosis: Other symptoms and signs involving the nervous system (R29.898) ?  ? ?Time: 7672-0947 ?PT Time Calculation (min) (ACUTE ONLY): 24 min ? ? ?Charges:   PT Evaluation ?$PT Eval Low Complexity: 1 Low ?PT Treatments ?$Gait Training: 8-22 mins ?  ?   ? ? ?05/30/2021 ? ?Ginger Carne., PT ?Acute Rehabilitation Services ?[phone]  (pager) ?[phone]  (office) ? ?Tessie Fass Shiana Rappleye ?05/30/2021, 12:55 PM ? ?

## 2021-05-31 ENCOUNTER — Inpatient Hospital Stay (HOSPITAL_COMMUNITY): Payer: Self-pay | Admitting: Anesthesiology

## 2021-05-31 ENCOUNTER — Encounter (HOSPITAL_COMMUNITY): Payer: Self-pay | Admitting: Internal Medicine

## 2021-05-31 ENCOUNTER — Encounter (HOSPITAL_COMMUNITY)
Admission: EM | Disposition: A | Discharge status: Home / Self Care | Payer: Self-pay | Source: Home / Self Care | Attending: Internal Medicine

## 2021-05-31 ENCOUNTER — Inpatient Hospital Stay (HOSPITAL_COMMUNITY): Payer: Self-pay

## 2021-05-31 ENCOUNTER — Other Ambulatory Visit: Payer: Self-pay | Admitting: Student

## 2021-05-31 DIAGNOSIS — I10 Essential (primary) hypertension: Secondary | ICD-10-CM

## 2021-05-31 DIAGNOSIS — Z7984 Long term (current) use of oral hypoglycemic drugs: Secondary | ICD-10-CM

## 2021-05-31 DIAGNOSIS — E119 Type 2 diabetes mellitus without complications: Secondary | ICD-10-CM

## 2021-05-31 DIAGNOSIS — I6522 Occlusion and stenosis of left carotid artery: Secondary | ICD-10-CM

## 2021-05-31 HISTORY — PX: IR INTRAVSC STENT CERV CAROTID W/EMB-PROT MOD SED INCL ANGIO: IMG2303

## 2021-05-31 HISTORY — PX: IR ANGIOGRAM FOLLOW UP STUDY: IMG697

## 2021-05-31 HISTORY — PX: IR US GUIDE VASC ACCESS RIGHT: IMG2390

## 2021-05-31 HISTORY — PX: IR ANGIO INTRA EXTRACRAN SEL INTERNAL CAROTID UNI L MOD SED: IMG5361

## 2021-05-31 HISTORY — PX: RADIOLOGY WITH ANESTHESIA: SHX6223

## 2021-05-31 LAB — COMPREHENSIVE METABOLIC PANEL
ALT: 20 U/L (ref 0–44)
AST: 16 U/L (ref 15–41)
Albumin: 3.2 g/dL — ABNORMAL LOW (ref 3.5–5.0)
Alkaline Phosphatase: 80 U/L (ref 38–126)
Anion gap: 7 (ref 5–15)
BUN: 21 mg/dL (ref 8–23)
CO2: 22 mmol/L (ref 22–32)
Calcium: 8.9 mg/dL (ref 8.9–10.3)
Chloride: 103 mmol/L (ref 98–111)
Creatinine, Ser: 1.4 mg/dL — ABNORMAL HIGH (ref 0.61–1.24)
GFR, Estimated: 57 mL/min — ABNORMAL LOW (ref >60)
Glucose, Bld: 350 mg/dL — ABNORMAL HIGH (ref 70–99)
Potassium: 4.4 mmol/L (ref 3.5–5.1)
Sodium: 132 mmol/L — ABNORMAL LOW (ref 135–145)
Total Bilirubin: 0.3 mg/dL (ref 0.3–1.2)
Total Protein: 6 g/dL — ABNORMAL LOW (ref 6.5–8.1)

## 2021-05-31 LAB — GLUCOSE, CAPILLARY
Glucose-Capillary: 369 mg/dL — ABNORMAL HIGH (ref 70–99)
Glucose-Capillary: 147 mg/dL — ABNORMAL HIGH (ref 70–99)
Glucose-Capillary: 154 mg/dL — ABNORMAL HIGH (ref 70–99)
Glucose-Capillary: 170 mg/dL — ABNORMAL HIGH (ref 70–99)
Glucose-Capillary: 145 mg/dL — ABNORMAL HIGH (ref 70–99)
Glucose-Capillary: 106 mg/dL — ABNORMAL HIGH (ref 70–99)
Glucose-Capillary: 188 mg/dL — ABNORMAL HIGH (ref 70–99)
Glucose-Capillary: 401 mg/dL — ABNORMAL HIGH (ref 70–99)
Glucose-Capillary: 444 mg/dL — ABNORMAL HIGH (ref 70–99)
Glucose-Capillary: 397 mg/dL — ABNORMAL HIGH (ref 70–99)

## 2021-05-31 LAB — CBC
HCT: 40.4 % (ref 39.0–52.0)
Hemoglobin: 13.3 g/dL (ref 13.0–17.0)
MCH: 29 pg (ref 26.0–34.0)
MCHC: 32.9 g/dL (ref 30.0–36.0)
MCV: 88 fL (ref 80.0–100.0)
Platelets: 290 10*3/uL (ref 150–400)
RBC: 4.59 MIL/uL (ref 4.22–5.81)
RDW: 12.3 % (ref 11.5–15.5)
WBC: 6.8 10*3/uL (ref 4.0–10.5)
nRBC: 0 % (ref 0.0–0.2)

## 2021-05-31 LAB — HEPARIN LEVEL (UNFRACTIONATED): Heparin Unfractionated: <0.1 IU/mL — ABNORMAL LOW (ref 0.30–0.70)

## 2021-05-31 LAB — PROTIME-INR
INR: 0.9 (ref 0.8–1.2)
Prothrombin Time: 12 seconds (ref 11.4–15.2)

## 2021-05-31 IMAGING — XA IR INTRAVSC STENT CERV CAROTID W/ EMB-PROT
11 of 14 series · 12 of 24 positions shown · IV contrast (IODINE)
Comparison: CT/CT angiogram of the head and neck [DATE].

INDICATION: 61-year-old male with past medical history of hypertension,
hyperlipidemia and a, diabetes mellitus and stroke 70 years ago
status post left carotid end arterectomy. He presented to ED on
[DATE] with right face tingling. MRI of the brain was positive
for acute and/or subacute infarcts in the left frontal, occipital,
and parietal cortex and white matter. CT angiogram of the head and
neck showed high-grade stenosis of the cervical left ICA. He comes
to our service today for a diagnostic cerebral angiogram with left
carotid angioplasty and stenting with use of cerebral protection
device.

EXAM:
ULTRASOUND-GUIDED VASCULAR [REDACTED] CEREBRAL ANGIOGRAM
LEFT ICA ANGIOPLASTY AND STENTING WITH CEREBRAL PROTECTION DEVICE
TECHNIQUE: Informed written consent was obtained from the patient after a
thorough discussion of the procedural risks, benefits and
alternatives. All questions were addressed.

[Series 1: fl neuro n · 1 of 10 frames shown]
[frame 2/10]
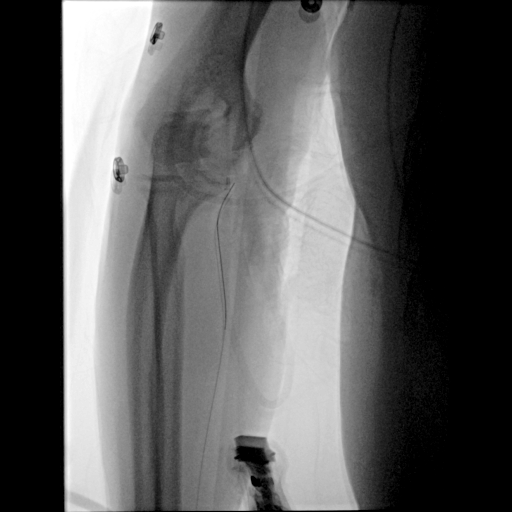

[Series 2: carotid care 2 · 1 of 10 frames shown (1 of 3)]
[frame 9/10]
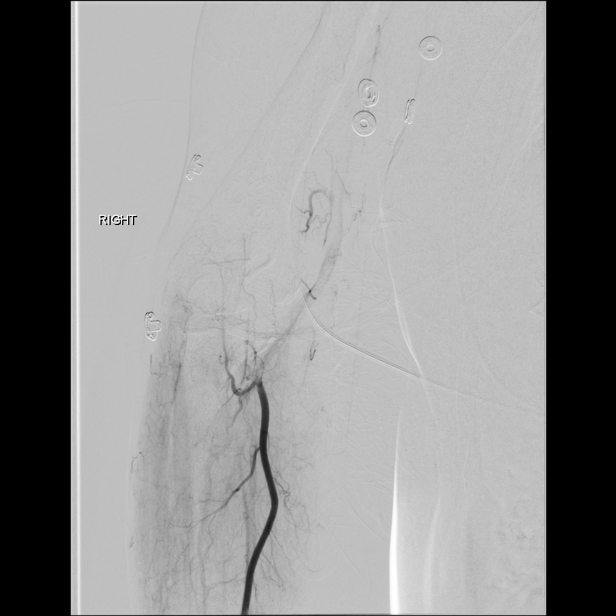

[Series 3: carotid care 2 · 2 acquisitions, 1 frame shown (2 of 3)]
[im 1/2]
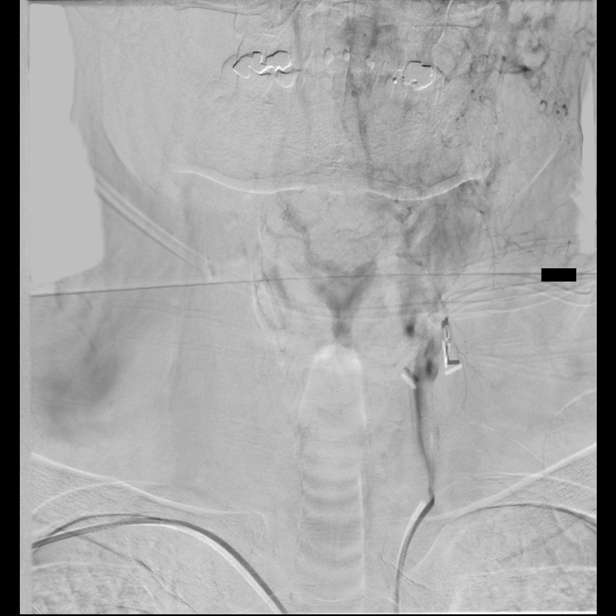

[Series 5: carotid care 2 · 2 acquisitions, 1 frame shown (3 of 3)]
[im 1/2]
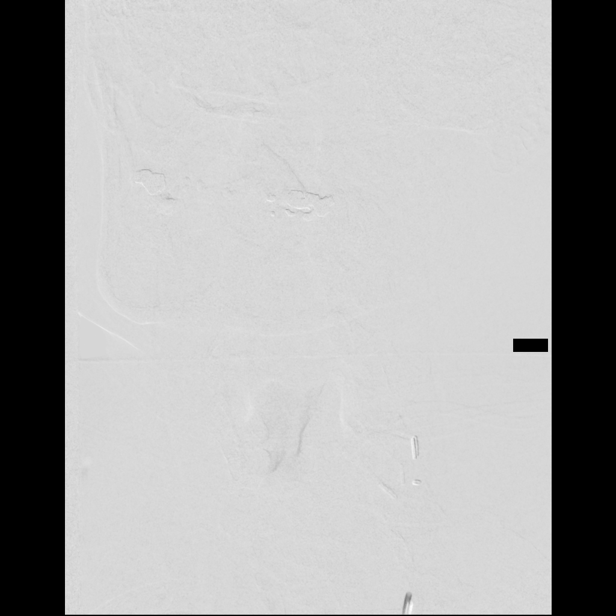

[Series 6: carotid 2 · 2 acquisitions, 1 frame shown (1 of 6)]
[im 1/2]
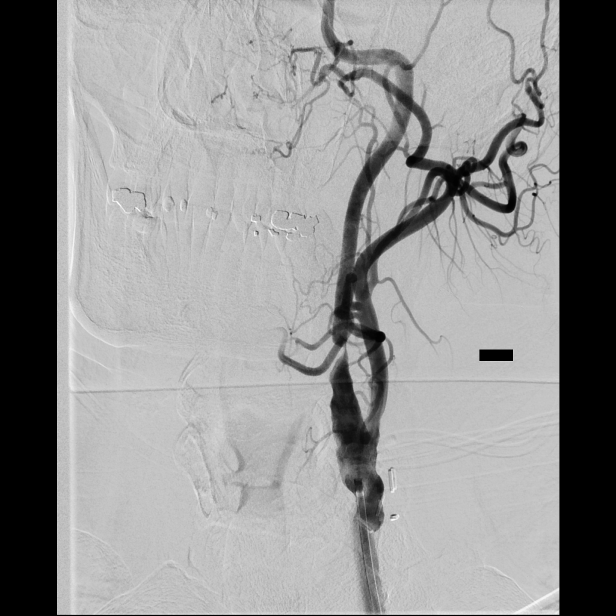

[Series 9: carotid 2 · 2 acquisitions, 1 frame shown (2 of 6)]
[im 1/2]
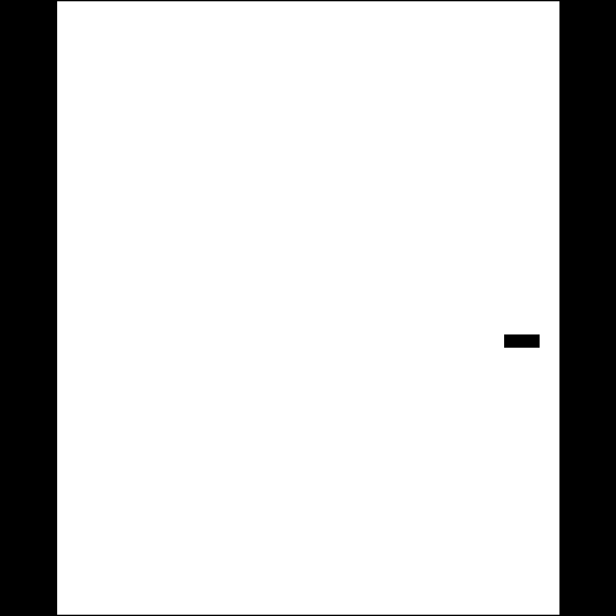

[Series 10: carotid 2 · 2 acquisitions, 1 frame shown (3 of 6)]
[im 1/2]
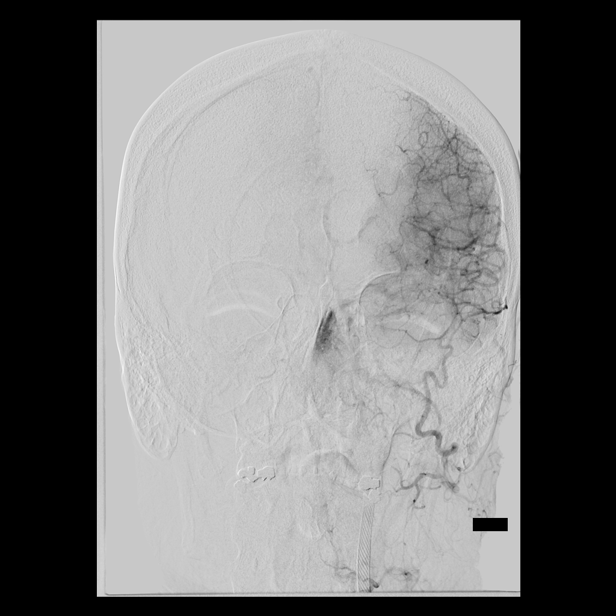

[Series 11: carotid 2 · 2 acquisitions, 1 frame shown (4 of 6)]
[im 1/2]
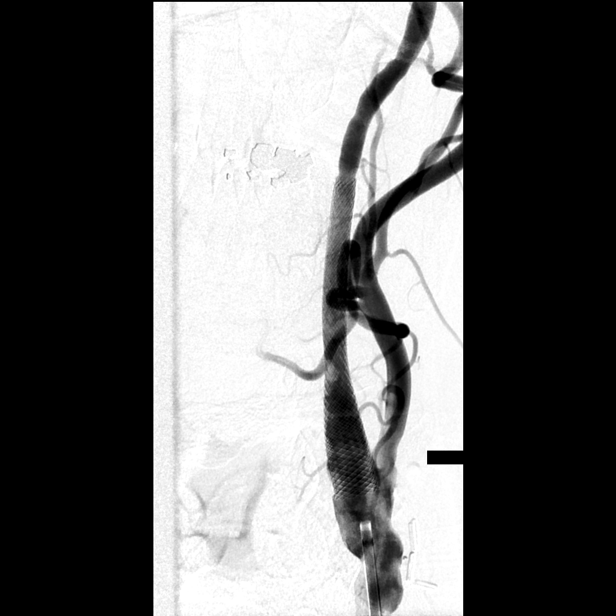

[Series 12: carotid 2 · 2 acquisitions, 1 frame shown (5 of 6)]
[im 1/2]
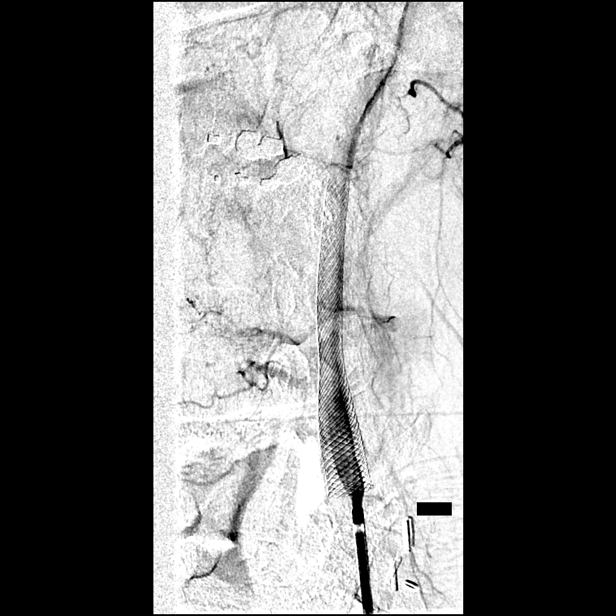

[Series 13: carotid 2 · 2 acquisitions, 1 frame shown (6 of 6)]
[im 1/2]
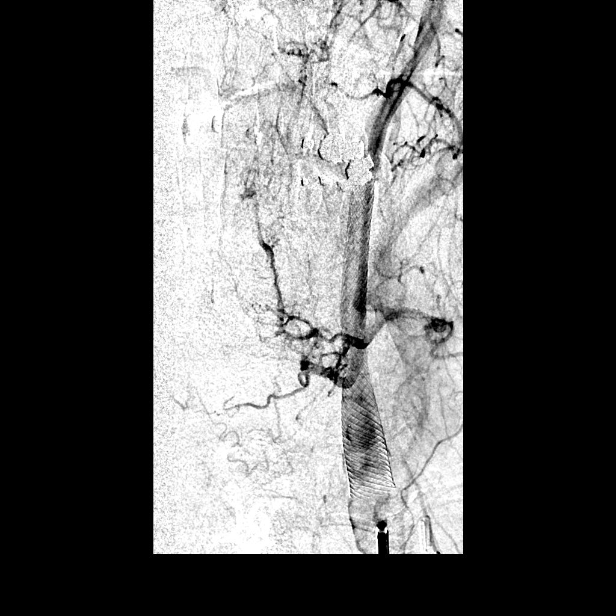

[Series 300: dr. (person_name) · 2 of 25 slices shown]
[im 11/25]
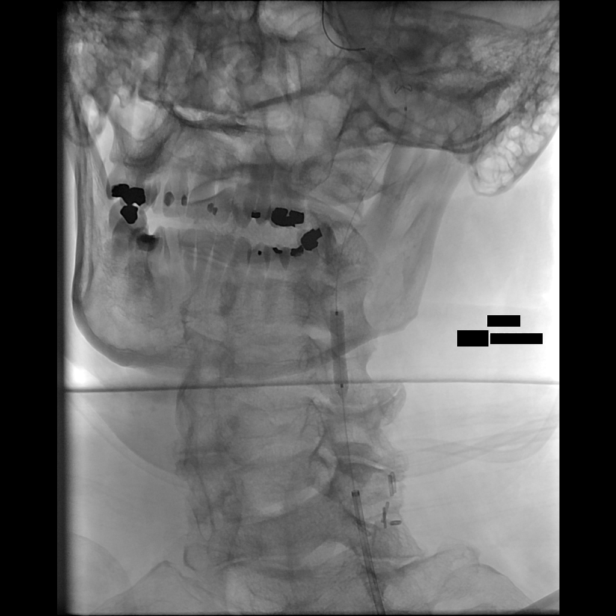
[im 25/25]
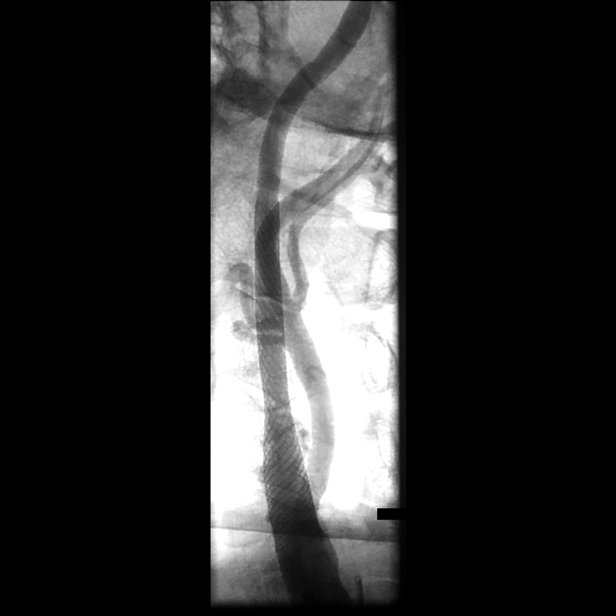

[12 of 24 positions shown; findings below may reference images not displayed]

MEDICATIONS:
Refer to anesthesia notes for sedation medication.

ANESTHESIA/SEDATION:
The procedure was performed under monitored anesthesia care (HUDGENS).

CONTRAST:  65 mL of Omnipaque 300 milligram/mL

FLUOROSCOPY:
Radiation Exposure Index (as provided by the fluoroscopic device):
560 mGy Kerma

COMPLICATIONS:
None immediate.
Maximal Sterile Barrier Technique was utilized including caps, mask,
sterile gowns, sterile gloves, sterile drape, hand hygiene and skin
antiseptic. A timeout was performed prior to the initiation of the
procedure.

Using the modified Seldinger technique and a micropuncture kit,
access was gained to the right radial artery at the wrist and a 7
French sheath was placed. Real-time ultrasound guidance was utilized
for vascular access including the acquisition of a permanent
ultrasound image documenting patency of the accessed vessel. Slow
intra arterial infusion of 5,000 HUDGENS heparin, 5 mg Verapamil and 200
mcg nitroglycerin diluted in patient's own blood was performed. No
significant fluctuation in patient's blood pressure seen. Then, a
right radial artery angiogram was obtained via sheath side port. The
right radial artery caliber is adequate for vascular access.

Next, a 5 HUDGENS 2 glide catheter was navigated over a
0.035" Terumo Glidewire into the right subclavian artery under
fluoroscopic guidance. The catheter was then advanced into the
aortic arch where the catheter tip was reformed. The catheter was
then placed into the left common carotid artery. Frontal, lateral
and bilateral oblique angiograms of the neck were obtained. Frontal
and lateral angiograms of the head were also obtained.
FINDINGS: The right radial artery has normal caliber, adequate for vascular
access. Normal brachial artery branching pattern seen. No
significant anatomical variation.

Hairline/near occlusive stenosis of the cervical left ICA just
distal to the bulb. Postsurgical changes from carotid endarterectomy
with capacious and irregular lumen at the distal left common carotid
artery and carotid bulb. Delayed contrast opacification of the
intracranial left ICA and MCA. No opacification of the left ACA.

PROCEDURE:
The 5 HUDGENS 2 glide catheter was exchanged over the wire
and under biplane roadmap guidance for a benchmark guide catheter
which was placed into the distal segment of the left common carotid
artery.

Frontal and lateral angiograms were obtained in utilized as biplane
roadmap.

Coaxial navigation of a 2.5-4.8 mm Emboshield NAV 6 cerebral
protection device was performed into the distal cervical segment of
the left ICA where the device was deployed. Angioplasty was then
performed at the level of the carotid stenosis with a 4 x 20 mm
Viatrac balloon. Next, an 8 x 36 mm carotid Wallstent was deployed
in the proximal cervical left ICA, across the area of stenosis. In
stent angioplasty was subsequently performed with a 5 x 30 mm
Viatrac balloon. The cerebral protection device was subsequently
recaptured and removed.

Left internal carotid artery angiogram with frontal and lateral
views of the neck showed adequate stent positioning with complete
resolution of stenosis. Angiograms with frontal and lateral views of
the head showed brisk contrast opacification of the left MCA
vascular tree. Flash filling of a bilateral ACA vasculature via a
hypoplastic left A1/ACA with rapid washout. No evidence of
thromboembolic complication.

Magnified frontal and lateral angiograms of the neck centered on the
recently deployed stent showed brisk flow. Concern for possible in
stent clot formation. Intra arterial infusion of 6 mg of Integrilin
was performed into the left ICA. Delayed angiograms showed brisk
opacification of the stent without filling defect.

The catheter was subsequently withdrawn.

An inflatable band was placed and inflated over the right wrist
access site. The vascular sheath was withdrawn and the band was
slowly deflated until brisk flow was noted through the arteriotomy
site. At this point, the band was reinflated with additional 3 cc of
air to obtain patent hemostasis.
IMPRESSION: 1. Successful and uncomplicated angioplasty and stenting of a
hairline stenosis of the cervical left ICA.
2. No evidence of thromboembolic complication.

PLAN:
1. Monitoring in ICU for 24 hours.
2. Continue dual anti-platelet therapy with aspirin and Plavix.

## 2021-05-31 SURGERY — IR WITH ANESTHESIA
Anesthesia: Monitor Anesthesia Care

## 2021-05-31 MED ORDER — HEPARIN (PORCINE) 25000 UT/250ML-% IV SOLN: 500.0000 [IU]/h | INTRAVENOUS | Status: DC

## 2021-05-31 MED ORDER — NITROGLYCERIN 1 MG/10 ML FOR IR/CATH LAB
100.0000 ug | INTRA_ARTERIAL | Status: AC
  Filled 2021-05-31: qty 1

## 2021-05-31 MED ORDER — LACTATED RINGERS IV SOLN: INTRAVENOUS | Status: DC | PRN

## 2021-05-31 MED ORDER — ORAL CARE MOUTH RINSE: 15.0000 mL | Freq: Once | OROMUCOSAL | Status: AC

## 2021-05-31 MED ORDER — FENTANYL CITRATE (PF) 100 MCG/2ML IJ SOLN
INTRAMUSCULAR | Status: DC | PRN
  Administered 2021-05-31 (×4): 25 ug via INTRAVENOUS

## 2021-05-31 MED ORDER — NITROGLYCERIN 1 MG/10 ML FOR IR/CATH LAB: INTRA_ARTERIAL | Status: AC | PRN

## 2021-05-31 MED ORDER — ASPIRIN 325 MG PO TABS: 325.0000 mg | ORAL_TABLET | Freq: Every day | ORAL | Status: DC

## 2021-05-31 MED ORDER — CHLORHEXIDINE GLUCONATE 0.12 % MT SOLN
OROMUCOSAL | Status: AC
  Administered 2021-05-31: 15 mL via OROMUCOSAL
  Filled 2021-05-31: qty 15

## 2021-05-31 MED ORDER — ACETAMINOPHEN 325 MG PO TABS: 650.0000 mg | ORAL_TABLET | ORAL | Status: DC | PRN

## 2021-05-31 MED ORDER — CLOPIDOGREL BISULFATE 75 MG PO TABS
75.0000 mg | ORAL_TABLET | Freq: Every day | ORAL | Status: DC
Start: 2021-06-01 — End: 2021-06-01
  Administered 2021-06-01: 75 mg via ORAL
  Filled 2021-05-31: qty 1

## 2021-05-31 MED ORDER — HEPARIN (PORCINE) 25000 UT/250ML-% IV SOLN
INTRAVENOUS | Status: AC
  Filled 2021-05-31: qty 250

## 2021-05-31 MED ORDER — CLEVIDIPINE BUTYRATE 0.5 MG/ML IV EMUL
0.0000 mg/h | INTRAVENOUS | Status: DC
  Administered 2021-05-31: 10 mg/h via INTRAVENOUS
  Administered 2021-05-31: 16 mg/h via INTRAVENOUS
  Filled 2021-05-31 (×2): qty 50

## 2021-05-31 MED ORDER — PHENYLEPHRINE 80 MCG/ML (10ML) SYRINGE FOR IV PUSH (FOR BLOOD PRESSURE SUPPORT)
PREFILLED_SYRINGE | INTRAVENOUS | Status: DC | PRN
  Administered 2021-05-31: 40 ug via INTRAVENOUS
  Administered 2021-05-31: 80 ug via INTRAVENOUS

## 2021-05-31 MED ORDER — IOHEXOL 300 MG/ML  SOLN
100.0000 mL | Freq: Once | INTRAMUSCULAR | Status: AC | PRN
  Administered 2021-05-31: 65 mL via INTRA_ARTERIAL

## 2021-05-31 MED ORDER — GLYCOPYRROLATE PF 0.2 MG/ML IJ SOSY
PREFILLED_SYRINGE | INTRAMUSCULAR | Status: DC | PRN
  Administered 2021-05-31: .2 mg via INTRAVENOUS

## 2021-05-31 MED ORDER — ONDANSETRON HCL 4 MG/2ML IJ SOLN: 4.0000 mg | Freq: Four times a day (QID) | INTRAMUSCULAR | Status: DC | PRN

## 2021-05-31 MED ORDER — CHLORHEXIDINE GLUCONATE 0.12 % MT SOLN: 15.0000 mL | Freq: Once | OROMUCOSAL | Status: AC

## 2021-05-31 MED ORDER — ACETAMINOPHEN 160 MG/5ML PO SOLN: 650.0000 mg | ORAL | Status: DC | PRN

## 2021-05-31 MED ORDER — CLEVIDIPINE BUTYRATE 0.5 MG/ML IV EMUL
INTRAVENOUS | Status: DC | PRN
  Administered 2021-05-31: 2 mg/h via INTRAVENOUS

## 2021-05-31 MED ORDER — MIDAZOLAM HCL 2 MG/2ML IJ SOLN
INTRAMUSCULAR | Status: DC | PRN
  Administered 2021-05-31: 1 mg via INTRAVENOUS
  Administered 2021-05-31 (×2): .5 mg via INTRAVENOUS

## 2021-05-31 MED ORDER — HEPARIN SODIUM (PORCINE) 1000 UNIT/ML IJ SOLN
INTRAMUSCULAR | Status: AC
  Filled 2021-05-31: qty 10

## 2021-05-31 MED ORDER — HEPARIN (PORCINE) 25000 UT/250ML-% IV SOLN
600.0000 [IU]/h | INTRAVENOUS | Status: DC
Start: 2021-05-31 — End: 2021-06-01
  Administered 2021-05-31: 600 [IU]/h via INTRAVENOUS
  Filled 2021-05-31: qty 250

## 2021-05-31 MED ORDER — ASPIRIN 325 MG PO TABS
325.0000 mg | ORAL_TABLET | Freq: Every day | ORAL | Status: DC
  Administered 2021-06-01: 325 mg via ORAL
  Filled 2021-05-31: qty 1

## 2021-05-31 MED ORDER — ACETAMINOPHEN 650 MG RE SUPP: 650.0000 mg | RECTAL | Status: DC | PRN

## 2021-05-31 MED ORDER — ONDANSETRON HCL 4 MG/2ML IJ SOLN
INTRAMUSCULAR | Status: DC | PRN
  Administered 2021-05-31: 4 mg via INTRAVENOUS

## 2021-05-31 MED ORDER — EPTIFIBATIDE 20 MG/10ML IV SOLN
INTRAVENOUS | Status: AC | PRN
  Administered 2021-05-31: 6 mg via INTRAVENOUS

## 2021-05-31 MED ORDER — LIDOCAINE HCL 1 % IJ SOLN
INTRAMUSCULAR | Status: AC
  Filled 2021-05-31: qty 20

## 2021-05-31 MED ORDER — INSULIN GLARGINE-YFGN 100 UNIT/ML ~~LOC~~ SOLN
5.0000 [IU] | Freq: Every day | SUBCUTANEOUS | Status: DC
  Administered 2021-05-31: 5 [IU] via SUBCUTANEOUS
  Filled 2021-05-31 (×2): qty 0.05

## 2021-05-31 MED ORDER — VERAPAMIL HCL 2.5 MG/ML IV SOLN
INTRAVENOUS | Status: AC
  Filled 2021-05-31: qty 2

## 2021-05-31 MED ORDER — ASPIRIN 81 MG PO CHEW
243.0000 mg | CHEWABLE_TABLET | Freq: Once | ORAL | Status: AC
  Administered 2021-05-31: 243 mg via ORAL
  Filled 2021-05-31: qty 3

## 2021-05-31 MED ORDER — FENTANYL CITRATE (PF) 250 MCG/5ML IJ SOLN: INTRAMUSCULAR | Status: DC | PRN

## 2021-05-31 MED ORDER — CLEVIDIPINE BUTYRATE 0.5 MG/ML IV EMUL
INTRAVENOUS | Status: AC
  Administered 2021-05-31: 4 mg/h via INTRAVENOUS
  Filled 2021-05-31: qty 50

## 2021-05-31 MED ORDER — ASPIRIN 81 MG PO CHEW
324.0000 mg | CHEWABLE_TABLET | Freq: Every day | ORAL | Status: DC
Start: 2021-06-01 — End: 2021-06-01

## 2021-05-31 MED ORDER — EPTIFIBATIDE 20 MG/10ML IV SOLN
INTRAVENOUS | Status: AC
  Filled 2021-05-31: qty 10

## 2021-05-31 MED ORDER — LACTATED RINGERS IV SOLN: INTRAVENOUS | Status: DC

## 2021-05-31 MED ORDER — CLOPIDOGREL BISULFATE 75 MG PO TABS: 75.0000 mg | ORAL_TABLET | Freq: Every day | ORAL | Status: DC

## 2021-05-31 MED ORDER — INSULIN ASPART 100 UNIT/ML IJ SOLN
0.0000 [IU] | INTRAMUSCULAR | Status: AC | PRN
  Administered 2021-05-31 (×2): 2 [IU] via SUBCUTANEOUS

## 2021-05-31 MED ORDER — CHLORHEXIDINE GLUCONATE CLOTH 2 % EX PADS
6.0000 | MEDICATED_PAD | Freq: Every day | CUTANEOUS | Status: DC
  Administered 2021-05-31 – 2021-06-01 (×2): 6 via TOPICAL

## 2021-05-31 NOTE — Anesthesia Procedure Notes (Signed)
?  Arterial Line Insertion ?Start/End5/03/2021 12:33 PM, 05/31/2021 12:37 PM ?Performed by: Brennan Bailey, MD, anesthesiologist ? Patient location: Pre-op. ?Preanesthetic checklist: patient identified, IV checked, site marked, risks and benefits discussed, surgical consent, monitors and equipment checked, pre-op evaluation, timeout performed and anesthesia consent ?Lidocaine 1% used for infiltration ?Left, radial was placed ?Catheter size: 20 G ?Hand hygiene performed  and maximum sterile barriers used  ? ?Attempts: 2 ?Procedure performed using ultrasound guided technique. ?Ultrasound Notes:anatomy identified, needle tip was noted to be adjacent to the nerve/plexus identified and no ultrasound evidence of intravascular and/or intraneural injection ?Following insertion, dressing applied and Biopatch. ?Post procedure assessment: normal and unchanged ? ? ? ?

## 2021-05-31 NOTE — Anesthesia Procedure Notes (Signed)
Procedure Name: Sandy ?Date/Time: 05/31/2021 1:10 PM ?Performed by: Janene Harvey, CRNA ?Pre-anesthesia Checklist: Patient identified, Emergency Drugs available, Suction available and Patient being monitored ?Patient Re-evaluated:Patient Re-evaluated prior to induction ?Oxygen Delivery Method: Nasal cannula ?Induction Type: IV induction ?Placement Confirmation: positive ETCO2 ?Dental Injury: Teeth and Oropharynx as per pre-operative assessment  ? ? ? ? ?

## 2021-05-31 NOTE — Sedation Documentation (Addendum)
Bedside hand off of care given to [place], [place]. All questions answered. Dopplerable pulse location to the right hand reviewed. Ria Comment, RN had no further questions prior to this RN's departure.  ? ?Pt transported to PACU with this RN and  CRNA.  ?

## 2021-05-31 NOTE — Anesthesia Preprocedure Evaluation (Addendum)
Anesthesia Evaluation  ?Patient identified by MRN, date of birth, ID band ?Patient awake ? ? ? ?Reviewed: ?Allergy & Precautions, NPO status , Patient's Chart, lab work & pertinent test results ? ?History of Anesthesia Complications ?Negative for: history of anesthetic complications ? ?Airway ?Mallampati: II ? ?TM Distance: >3 FB ?Neck ROM: Full ? ? ? Dental ?no notable dental hx. ? ?  ?Pulmonary ?Current Smoker and Patient abstained from smoking.,  ?  ?Pulmonary exam normal ? ? ? ? ? ? ? Cardiovascular ?hypertension, Pt. on medications ?Normal cardiovascular exam ? ?TTE 05/30/21: EF 45-50%, mild hypokinesis of the left ventricular, entire septal wall, valves ok ?  ?Neuro/Psych ?Anxiety history of stroke about 7 years ago status post left carotid endarterectomy  ?CVA (05/29/21, right facial twitching)   ? GI/Hepatic ?negative GI ROS, Neg liver ROS,   ?Endo/Other  ?diabetes, Type 2, Oral Hypoglycemic AgentsHypothyroidism  ? Renal/GU ?Renal InsufficiencyRenal disease  ?negative genitourinary ?  ?Musculoskeletal ?negative musculoskeletal ROS ?(+)  ? Abdominal ?  ?Peds ? Hematology ?negative hematology ROS ?(+)   ?Anesthesia Other Findings ?Day of surgery medications reviewed with patient. ? Reproductive/Obstetrics ?negative OB ROS ? ?  ? ? ? ? ? ? ? ? ? ? ? ? ? ?  ?  ? ? ? ? ? ? ?Anesthesia Physical ?Anesthesia Plan ? ?ASA: 4 ? ?Anesthesia Plan: MAC  ? ?Post-op Pain Management: Minimal or no pain anticipated  ? ?Induction:  ? ?PONV Risk Score and Plan: 0 and Treatment may vary due to age or medical condition and Propofol infusion ? ?Airway Management Planned: Natural Airway and Simple Face Mask ? ?Additional Equipment: Arterial line ? ?Intra-op Plan:  ? ?Post-operative Plan:  ? ?Informed Consent: I have reviewed the patients History and Physical, chart, labs and discussed the procedure including the risks, benefits and alternatives for the proposed anesthesia with the patient or  authorized representative who has indicated his/her understanding and acceptance.  ? ? ? ? ? ?Plan Discussed with: CRNA ? ?Anesthesia Plan Comments:   ? ? ? ? ?Anesthesia Quick Evaluation ? ?

## 2021-05-31 NOTE — Consult Note (Signed)
? ?Chief Complaint: ?Patient was seen in consultation today for cerebral angiogram with possible intervention  ?Chief Complaint  ?Patient presents with  ? Seizures  ? at the request of Dr. Erlinda Hong, J.  ? ?Referring Physician(s): Dr. Erlinda Hong, J.  ? ?Supervising Physician: Pedro Earls ? ?Patient Status: [facility] - In-pt ? ?History of Present Illness: ?Hunter Fields is a 61 y.o. male with PMHs of HTN, HLD, DM, and stroke about 7 years ago status post left carotid endarterectomy who was brought to [facility] ED on 4/30 due to right face tingling.  ? ?Patient underwent MRI in ED which showed: ? ?1. Acute and/or subacute infarcts in the left frontal, occipital, ?and parietal cortex and white matter. Contrast enhancement ?associated with some of the left frontal cortical infarcts suggest ?that these are subacute. No enhancement is associated with the ?remainder of the infarcts, which are likely acute. ?2. Loss of the distal left vertebral artery flow void, with poor ?opacification on postcontrast imaging, concerning for severe ?stenosis. ? ?Neurology was consulted and patient was admitted for further evaluation, underwent MRA head and CTA head and neck which showed near-occlusive focal stenosis in the proximal left ICA.  ? ?NIR was consulted for cerebral angiogram with possible intervention.  ?Case was reviewed and approve by Dr. Karenann Cai for cerebral angiogram with left ICA stent placement under MAC sedation.  ?Patient seen in inpatient room with Dr. Julius Bowels this morning.  ? ?Patient laying in bed, not in acute distress.  ?Denise headache, fever, chills, shortness of breath, cough, chest pain, abdominal pain, nausea ,vomiting, and bleeding. ? ?The benefit and risk of cerebral angiogram with interventionwas discussed with the patient in detail by Dr. Karenann Cai.  ?After thorough discussion and shared decision making, patient decided to proceed with cerebral angiogram with left ICA stent  placement today.  ?Patient was informed that he will stay in NICU after the procedure for at least one night.  ?He was also informed that he will ned to take Plavix and aspirin for at least 3 month, he will be scheduled for VAS US carotid bilateral in 3 months after the procedure to re-assess left ICA stent/ stent patency.  ?He verbalized understanding.  ?  ? ?Past Medical History:  ?Diagnosis Date  ? Allergy   ? Anxiety   ? Diabetes mellitus   ? Hyperlipidemia   ? Hypertension   ? Hypothyroid   ? Testosterone deficiency   ? ? ?Past Surgical History:  ?Procedure Laterality Date  ? APPENDECTOMY  2006  ? COLON SURGERY  2006  ? large diverticulum resected  ? COLONOSCOPY  09/23/2010  ? diverticulosis, lymphoid rectal polyp  ? ? ?Allergies: ?Food ? ?Medications: ?Prior to Admission medications   ?Medication Sig Start Date End Date Taking? Authorizing Provider  ?aspirin 81 MG EC tablet Take 81 mg by mouth daily.   Yes [provider]  ?diazepam (VALIUM) 10 MG tablet Take 1/2 to 1 tablet by mouth 4 times daily for tremor. ?Patient taking differently: Take 10 mg by mouth every 6 (six) hours as needed for anxiety. 03/11/15  Yes Unk Pinto, MD  ?metFORMIN (GLUCOPHAGE) 1000 MG tablet Take 1,000 mg by mouth 2 (two) times daily with a meal.   Yes [provider]  ?Multiple Vitamin (MULTIVITAMIN) tablet Take 1 tablet by mouth daily.     Yes [provider]  ?fenofibrate micronized (LOFIBRA) 134 MG capsule TAKE 1 CAPSULE DAILY FOR BLOOD FATS ?Patient not taking: Reported  on 05/29/2021 02/10/14   Unk Pinto, MD  ?glyBURIDE (DIABETA) 5 MG tablet Takes 1/2 to 1 tab po BID-TID daily for diabetes ?Patient not taking: Reported on 05/29/2021 10/20/13   Unk Pinto, MD  ?HYDROcodone-acetaminophen [facility]) 5-325 MG per tablet Take 1/2 to 1 tablet every 3 to 4 hours is needed for cough or pain ?Patient not taking: Reported on 05/29/2021 02/04/14   Unk Pinto, MD  ?levothyroxine (SYNTHROID) 75 MCG  tablet Take 1 tablet (75 mcg total) by mouth daily. ?Patient not taking: Reported on 05/29/2021 02/10/14 02/10/15  Unk Pinto, MD  ?lisinopril (PRINIVIL,ZESTRIL) 40 MG tablet Take 1 tablet (40 mg total) by mouth daily. ?Patient not taking: Reported on 05/29/2021 02/10/14   Unk Pinto, MD  ?losartan-hydrochlorothiazide [facility]) 100-25 MG per tablet Take 1 tablet by mouth daily. ?Patient not taking: Reported on 05/29/2021 02/11/14 07/12/15  Unk Pinto, MD  ?metFORMIN (GLUCOPHAGE-XR) 500 MG 24 hr tablet TAKE 2 TABLETS TWICE A DAY FOR DIABETES MELLITUS ?Patient not taking: Reported on 05/29/2021 02/10/14   Unk Pinto, MD  ?testosterone cypionate (DEPOTESTOSTERONE CYPIONATE) 200 MG/ML injection inject 2 ml's intramuscularly every 2 weeks as directed by physician & 63m syringes and 1" 21 G needles ?Patient not taking: Reported on 05/29/2021 08/24/14 02/24/15  MUnk Pinto MD  ?  ? ?History reviewed. No pertinent family history. ? ?Social History  ? ?Socioeconomic History  ? Marital status: Legally Separated  ?  Spouse name: Not on file  ? Number of children: Not on file  ? Years of education: Not on file  ? Highest education level: Not on file  ?Occupational History  ? Not on file  ?Tobacco Use  ? Smoking status: Every Day  ?  Packs/day: 0.50  ?  Types: Cigarettes  ?  Last attempt to quit: 01/31/1995  ?  Years since quitting: 26.3  ? Smokeless tobacco: Never  ?Substance and Sexual Activity  ? Alcohol use: Yes  ?  Alcohol/week: 2.0 standard drinks  ?  Types: 2 Standard drinks or equivalent per week  ?  Comment: OCC WINE OR BEER,MAYBE MONTHLY  ? Drug use: No  ? Sexual activity: Not on file  ?Other Topics Concern  ? Not on file  ?Social History Narrative  ? Not on file  ? ?Social Determinants of Health  ? ?Financial Resource Strain: Not on file  ?Food Insecurity: Not on file  ?Transportation Needs: Not on file  ?Physical Activity: Not on file  ?Stress: Not on file  ?Social Connections: Not on file   ? ? ? ?Review of Systems: A 12 point ROS discussed and pertinent positives are indicated in the HPI above.  All other systems are negative. ? ?Vital Signs: ?BP 136/83 (BP Location: Left Arm)   Pulse 73   Temp 97.8 ?F (36.6 ?C) (Oral)   Resp 16   Ht _0  (1.727 m)   Wt 157 lb 3 oz (71.3 kg)   SpO2 98%   BMI 23.90 kg/m?  ? ?Physical Exam ?Vitals and nursing note reviewed.  ?Constitutional:   ?   General: Patient is not in acute distress. ?   Appearance: Normal appearance. Patient is not ill-appearing.  ?HENT:  ?   Head: Normocephalic and atraumatic.  ?   Mouth/Throat:  ?   Mouth: Mucous membranes are moist.  ?   Pharynx: Oropharynx is clear.  ?Cardiovascular:  ?   Rate and Rhythm: Normal rate and regular rhythm.  ?   Pulses: Normal pulses.  ?   Heart  sounds: Normal heart sounds.  ?Pulmonary:  ?   Effort: Pulmonary effort is normal.  ?   Breath sounds: Normal breath sounds.  ?Abdominal:  ?   General: Abdomen is flat. Bowel sounds are normal.  ?   Palpations: Abdomen is soft.  ?Musculoskeletal:  ?   Cervical back: Neck supple.  ?Skin: ?   General: Skin is warm and dry.  ?   Coloration: Skin is not jaundiced or pale.  ?Neurological:  ?   Mental Status: Patient is alert and oriented to person, place, and time.  ?Psychiatric:     ?   Mood and Affect: Mood normal.     ?   Behavior: Behavior normal.     ?   Judgment: Judgment normal.  ? ? ?MD Evaluation ?Airway: WNL ?Heart: WNL ?Abdomen: WNL ?Chest/ Lungs: WNL ?ASA  Classification: 3 ?Mallampati/Airway Score: Two ? ?Imaging: ?CT ANGIO HEAD NECK W WO CM ? ?Result Date: 05/30/2021 ?CLINICAL DATA:  Provided history: Stroke/TIA, determine embolic source. EXAM: CT ANGIOGRAPHY HEAD AND NECK TECHNIQUE: Multidetector CT imaging of the head and neck was performed using the standard protocol during bolus administration of intravenous contrast. Multiplanar CT image reconstructions and MIPs were obtained to evaluate the vascular anatomy. Carotid stenosis measurements (when  applicable) are obtained utilizing NASCET criteria, using the distal internal carotid diameter as the denominator. RADIATION DOSE REDUCTION: This exam was performed according to the departmental dose-optimization program which includes

## 2021-05-31 NOTE — Anesthesia Postprocedure Evaluation (Signed)
Anesthesia Post Note ? ?Patient: Hunter Fields ? ?Procedure(s) Performed: IR WITH ANESTHESIA ? ?  ? ?Patient location during evaluation: PACU ?Anesthesia Type: MAC ?Level of consciousness: awake and alert ?Pain management: pain level controlled ?Vital Signs Assessment: post-procedure vital signs reviewed and stable ?Respiratory status: spontaneous breathing, nonlabored ventilation, respiratory function stable and patient connected to nasal cannula oxygen ?Cardiovascular status: stable and blood pressure returned to baseline ?Postop Assessment: no apparent nausea or vomiting ?Anesthetic complications: no ? ? ?No notable events documented. ? ?Last Vitals:  ?Vitals:  ? 05/31/21 1610 05/31/21 1642  ?BP: 126/80 114/68  ?Pulse: 75   ?Resp: 17 14  ?Temp: 36.6 ?C 36.8 ?C  ?SpO2: 97%   ?  ?Last Pain:  ?Vitals:  ? 05/31/21 1642  ?TempSrc: Oral  ?PainSc:   ? ? ?LLE Motor Response: Purposeful movement (05/31/21 1642) ?  ?RLE Motor Response: Purposeful movement (05/31/21 1642) ?  ?  ?  ? ?Effie Berkshire ? ? ? ? ?

## 2021-05-31 NOTE — Progress Notes (Signed)
CBG 444, recheck 401, hospitalist aware. Patient had orders for a regular diet and recently had cake, graham crackers, and cola. Glucose labs drawn, 5 units of insulin given and a CBG check at 0300 per APP.  ?

## 2021-05-31 NOTE — Transfer of Care (Signed)
Immediate Anesthesia Transfer of Care Note ? ?Patient: Hunter Fields ? ?Procedure(s) Performed: IR WITH ANESTHESIA ? ?Patient Location: PACU ? ?Anesthesia Type:MAC ? ?Level of Consciousness: awake, alert  and patient cooperative ? ?Airway & Oxygen Therapy: Patient Spontanous Breathing ? ?Post-op Assessment: Report given to RN and Post -op Vital signs reviewed and stable ? ?Post vital signs: Reviewed and stable ? ?Last Vitals:  ?Vitals Value Taken Time  ?BP 102/67 05/31/21 1538  ?Temp    ?Pulse 81 05/31/21 1544  ?Resp 19 05/31/21 1544  ?SpO2 100 % 05/31/21 1544  ?Vitals shown include unvalidated device data. ? ?Last Pain:  ?Vitals:  ? 05/31/21 1329  ?TempSrc:   ?PainSc: 0-No pain  ?   ? ?  ? ?Complications: No notable events documented. ?

## 2021-05-31 NOTE — Progress Notes (Addendum)
? ?[facility] ?PROGRESS NOTE ? ? ?Hunter Fields IRS:[id number] DOB: 12-07-1960 DOA: 05/29/2021 ? ?PCP: Pcp, No ? ?Brief History/Interval Summary: 61 y.o. male with a past medical history of diabetes mellitus, hypertension, history of stroke about 7 years ago status post left carotid endarterectomy who was in his usual state of health till about 11:30 on the morning of admission.  He had also noted some numbness in his right hand previously.  Initially there was concern for seizure activity.  Patient was loaded with Keppra.  Subsequently MRI showed acute infarcts.  Neurology is following.  ? ?Consultants: Neurology ? ?Procedures: EEG.  Transthoracic echocardiogram. ? ? ? ?Subjective/Interval History: ?Patient mentions that the numbness in his right hand is improving.  Denies any complaints.  No shortness of breath.   ? ? ?Assessment/Plan: ? 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  Initially concern for seizure activity.  MRI shows acute infarcts in the left hemisphere.  Patient seen by neurology.  On aspirin and Plavix.  Patient was on aspirin previously. ?HbA1c is pending ?Direct LDL is 82.  Triglycerides 782.  Patient on fenofibrate prior to admission.  There is no mention of statin.  Started on Crestor. ?Seen by PT and OT.  Patient and OT is recommended. ?CT angiogram head and neck showed significant disease in the left ICA.  Neuro interventional radiology has been consulted by neurology for stent placement. ?EEG did not show any epileptiform activity.  Patient was continued on Keppra by neurology. ?Transthoracic echocardiogram shows EF of 45 to 50%.  Mild hypokinesis of the septal wall was noted.  No previous echocardiogram reports in EMR.  Patient will benefit from outpatient consultation with cardiology.  This can be pursued by PCP. ? ?Hyponatremia ?Likely due to hypovolemia.  Resolved. ? ?Essential hypertension ?Blood  pressure reasonably well controlled.  Permissive hypertension. ?He is on lisinopril, losartan-HCTZ prior to admission ? ?Diabetes mellitus type 2, uncontrolled with hyperglycemia ?It appears the patient is on glyburide and metformin prior to admission.  CBGs are very poorly controlled.  HbA1c is still pending.  Will initiate low-dose of glargine today. ? ?Hyperlipidemia ?See above.  On Crestor currently.  Was on fenofibrate prior to admission. ? ?Previous history of stroke ?This was approximately 7 years ago when he was in [facility].  He is status post left carotid endarterectomy ? ?Chronic kidney disease stage II ?Renal function at baseline.  Continue to monitor.  Avoid nephrotoxic agents. ? ?Tobacco abuse ?Counseled. ? ?DVT Prophylaxis: Lovenox ?Code Status: Full code ?Family Communication: Discussed with patient ?Disposition Plan: Hopefully discharge home tomorrow if carotid stent is placed today and if he remains stable overnight. ? ?Status is: Inpatient ?Remains inpatient appropriate because: Acute stroke ? ? ? ? ? ?Medications: Scheduled: ? aspirin EC  81 mg Oral Daily  ? chlorhexidine gluconate (MEDLINE KIT)  15 mL Mouth Rinse BID  ? clopidogrel  75 mg Oral Daily  ? enoxaparin (LOVENOX) injection  40 mg Subcutaneous Q24H  ? insulin aspart  0-15 Units Subcutaneous TID WC  ? insulin aspart  0-5 Units Subcutaneous QHS  ? insulin glargine-yfgn  5 Units Subcutaneous Daily  ? rosuvastatin  20 mg Oral Daily  ? ?Continuous: ? levETIRAcetam Stopped (05/30/21 2135)  ? ?[id number] **OR** acetaminophen, diazepam, ondansetron **OR** ondansetron (ZOFRAN) IV ? ?Antibiotics: ?Anti-infectives (From admission, onward)  ? ? None  ? ?  ? ? ?Objective: ? ?Vital Signs ? ?Vitals:  ?  05/30/21 1557 05/30/21 1924 05/30/21 2318 05/31/21 0416  ?BP: (!) 147/90 127/79 135/79 136/83  ?Pulse: 75 79 83 73  ?Resp: 17 18 18 16  ?Temp: 97.9 ?F (36.6 ?C) 98.4 ?F (36.9 ?C) 98.4 ?F (36.9 ?C) 97.8 ?F (36.6 ?C)  ?TempSrc: Oral  Oral Oral Oral  ?SpO2: 100% 98% 100% 98%  ?Weight:      ?Height:      ? ? ?Intake/Output Summary (Last 24 hours) at 05/31/2021 0850 ?Last data filed at 05/31/2021 0653 ?Gross per 24 hour  ?Intake 200 ml  ?Output 325 ml  ?Net -125 ml  ? ? ?Filed Weights  ? 05/29/21 2300  ?Weight: 71.3 kg  ? ? ?General appearance: Awake alert.  In no distress ?Resp: Clear to auscultation bilaterally.  Normal effort ?Cardio: S1-S2 is normal regular.  No S3-S4.  No rubs murmurs or bruit ?GI: Abdomen is soft.  Nontender nondistended.  Bowel sounds are present normal.  No masses organomegaly ?Extremities: No edema.  Full range of motion of lower extremities. ? ? ? ?Lab Results: ? ?Data Reviewed: I have personally reviewed following labs and reports of the imaging studies ? ?CBC: ?Recent Labs  ?Lab 05/29/21 ?1246 05/29/21 ?2358 05/31/21 ?0256  ?WBC 6.5 6.2 6.8  ?NEUTROABS 4.8  --   --   ?HGB 13.5 14.5 13.3  ?HCT 40.5 42.7 40.4  ?MCV 87.5 87.0 88.0  ?PLT 278 302 290  ? ? ? ?Basic Metabolic Panel: ?Recent Labs  ?Lab 05/29/21 ?1246 05/29/21 ?2358 05/31/21 ?0256  ?NA 130* 138 132*  ?K 4.4 3.8 4.4  ?CL 102 103 103  ?CO2 20* 24 22  ?GLUCOSE 392* 88 350*  ?BUN 33* 26* 21  ?CREATININE 1.48* 1.33* 1.40*  ?CALCIUM 8.9 9.3 8.9  ?MG  --  1.6*  --   ? ? ? ?GFR: ?Estimated Creatinine Clearance: 53.6 mL/min (A) (by C-G formula based on SCr of 1.4 mg/dL (H)). ? ?Liver Function Tests: ?Recent Labs  ?Lab 05/29/21 ?2358 05/31/21 ?0256  ?AST 17 16  ?ALT 18 20  ?ALKPHOS 72 80  ?BILITOT 0.3 0.3  ?PROT 6.7 6.0*  ?ALBUMIN 3.5 3.2*  ? ? ? ? ?Cardiac Enzymes: ?Recent Labs  ?Lab 05/29/21 ?2358  ?CKTOTAL 96  ? ? ? ?CBG: ?Recent Labs  ?Lab 05/30/21 ?0638 05/30/21 ?1226 05/30/21 ?1600 05/30/21 ?2130 05/31/21 ?0620  ?GLUCAP 194* 365* 257* 347* 369*  ? ? ? ?Lipid Profile: ?Recent Labs  ?  05/29/21 ?2358  ?CHOL 249*  ?HDL 30*  ?LDLCALC UNABLE TO CALCULATE IF TRIGLYCERIDE OVER 400 mg/dL  ?TRIG 782*  ?CHOLHDL 8.3  ?LDLDIRECT 82.0  ? ? ? ?Thyroid Function Tests: ?Recent Labs  ?   05/29/21 ?2358  ?TSH 4.507*  ? ? ? ? ?Radiology Studies: ?CT ANGIO HEAD NECK W WO CM ? ?Result Date: 05/30/2021 ?CLINICAL DATA:  Provided history: Stroke/TIA, determine embolic source. EXAM: CT ANGIOGRAPHY HEAD AND NECK TECHNIQUE: Multidetector CT imaging of the head and neck was performed using the standard protocol during bolus administration of intravenous contrast. Multiplanar CT image reconstructions and MIPs were obtained to evaluate the vascular anatomy. Carotid stenosis measurements (when applicable) are obtained utilizing NASCET criteria, using the distal internal carotid diameter as the denominator. RADIATION DOSE REDUCTION: This exam was performed according to the departmental dose-optimization program which includes automated exposure control, adjustment of the mA and/or kV according to patient size and/or use of iterative reconstruction technique. CONTRAST:  75mL OMNIPAQUE IOHEXOL 350 MG/ML SOLN COMPARISON:  MRI brain 05/29/2021. MRA head 05/30/2021.   FINDINGS: CT HEAD FINDINGS Brain: No age-advanced or lobar predominant parenchymal atrophy. Multiple acute/early subacute cortical and subcortical infarcts within the left frontal, parietal, occipital and posterior temporal lobes, some of which were better appreciated on the recent prior brain MRI of 05/29/2021. Redemonstrated chronic cortical/subcortical infarct within the posterosuperior left temporal lobe, left temporoparietal junction and left insula. Background mild patchy and ill-defined hypoattenuation within the cerebral white matter, nonspecific but compatible with chronic small vessel ischemic disease. There is no acute intracranial hemorrhage. No extra-axial fluid collection. No evidence of an intracranial mass. No midline shift. Vascular: No hyperdense vessel.  Atherosclerotic calcifications. Skull: Normal. Negative for fracture or focal lesion. Sinuses: Minimal mucosal thickening within the bilateral ethmoid sinuses. Orbits: No orbital mass or  acute orbital finding. Review of the MIP images confirms the above findings CTA NECK FINDINGS Aortic arch: Standard aortic branching. Atherosclerotic plaque within the visualized aortic arch and proximal ma

## 2021-05-31 NOTE — Plan of Care (Signed)
?  Problem: Education: ?Goal: Expressions of having a comfortable level of knowledge regarding the disease process will increase ?Outcome: Progressing ?  ?Problem: Coping: ?Goal: Ability to adjust to condition or change in health will improve ?Outcome: Progressing ?Goal: Ability to identify appropriate support needs will improve ?Outcome: Progressing ?  ?Problem: Health Behavior/Discharge Planning: ?Goal: Compliance with prescribed medication regimen will improve ?Outcome: Progressing ?  ?Problem: Medication: ?Goal: Risk for medication side effects will decrease ?Outcome: Progressing ?  ?Problem: Clinical Measurements: ?Goal: Complications related to the disease process, condition or treatment will be avoided or minimized ?Outcome: Progressing ?Goal: Diagnostic test results will improve ?Outcome: Progressing ?  ?Problem: Safety: ?Goal: Verbalization of understanding the information provided will improve ?Outcome: Progressing ?  ?Problem: Self-Concept: ?Goal: Level of anxiety will decrease ?Outcome: Progressing ?Goal: Ability to verbalize feelings about condition will improve ?Outcome: Progressing ?  ?Problem: Education: ?Goal: Knowledge of General Education information will improve ?Description: Including pain rating scale, medication(s)/side effects and non-pharmacologic comfort measures ?Outcome: Progressing ?  ?Problem: Education: ?Goal: Knowledge of disease or condition will improve ?Outcome: Progressing ?Goal: Knowledge of secondary prevention will improve (SELECT ALL) ?Outcome: Progressing ?Goal: Knowledge of patient specific risk factors will improve (INDIVIDUALIZE FOR PATIENT) ?Outcome: Progressing ?Goal: Individualized Educational Video(s) ?Outcome: Progressing ?  ?Problem: Coping: ?Goal: Will verbalize positive feelings about self ?Outcome: Progressing ?Goal: Will identify appropriate support needs ?Outcome: Progressing ?  ?Problem: Health Behavior/Discharge Planning: ?Goal: Ability to manage health-related  needs will improve ?Outcome: Progressing ?  ?Problem: Self-Care: ?Goal: Ability to participate in self-care as condition permits will improve ?Outcome: Progressing ?Goal: Verbalization of feelings and concerns over difficulty with self-care will improve ?Outcome: Progressing ?Goal: Ability to communicate needs accurately will improve ?Outcome: Progressing ?  ?Problem: Nutrition: ?Goal: Risk of aspiration will decrease ?Outcome: Progressing ?Goal: Dietary intake will improve ?Outcome: Progressing ?  ?Problem: Intracerebral Hemorrhage Tissue Perfusion: ?Goal: Complications of Intracerebral Hemorrhage will be minimized ?Outcome: Progressing ?  ?Problem: Ischemic Stroke/TIA Tissue Perfusion: ?Goal: Complications of ischemic stroke/TIA will be minimized ?Outcome: Progressing ?  ?Problem: Education: ?Goal: Knowledge of disease or condition will improve ?Outcome: Progressing ?Goal: Knowledge of secondary prevention will improve (SELECT ALL) ?Outcome: Progressing ?  ?

## 2021-05-31 NOTE — Procedures (Signed)
INTERVENTIONAL NEURORADIOLOGY BRIEF POSTPROCEDURE NOTE ? ?CEREBRAL ANGIOGRAM AND LEFT ICA STENTING WITH CEREBRAL PROTECTION DEVICE ? ?Attending: Dr. Pedro Earls ? ?Assistant: None. ? ?Diagnosis: Left ICA stenosis. ? ?Access site: Right radial artery. ? ?Access closure: Inflatable band. ? ?Anesthesia: MAC. ? ?Medication used: Refer to anesthesia documentation. ? ?Complications: None. ? ?Estimated blood loss: Minimal. ? ?Specimen: None. ? ?Findings: Hairline stenosis of the cervical left ICA just distal to the bulb. Angioplasty and stenting performed with use of a cerebral protection device and placement of a 8 x 36 mm carotid wallstent. No thromboembolic complication noted. Neuro exam at baseline. ? ?The patient tolerated the procedure well without incident or complication and is in stable condition.  ? ?PLAN:  ?- Transfer to ICU. ?- SBP 100-140 mmHg. ?  ?

## 2021-05-31 NOTE — Progress Notes (Signed)
ANTICOAGULATION CONSULT NOTE - Initial Consult ? ?Pharmacy Consult for Heparin ?Indication:  post-neuro interventional radiology ? ?Allergies  ?Allergen Reactions  ? Food Swelling  ?  MUSHROOMS=SWELLING  ? ? ?Patient Measurements: ?Height: '5\' 8"'$  (172.7 cm) ?Weight: 71.3 kg (157 lb 3 oz) ?IBW/kg (Calculated) : 68.4 ? ? ?Vital Signs: ?Temp: 98.3 ?F (36.8 ?C) (05/02 1036) ?Temp Source: Oral (05/02 1036) ?BP: 133/87 (05/02 1036) ?Pulse Rate: 74 (05/02 1036) ? ?Labs: ?Recent Labs  ?  05/29/21 ?1246 05/29/21 ?2358 05/31/21 ?0256  ?HGB 13.5 14.5 13.3  ?HCT 40.5 42.7 40.4  ?PLT 278 302 290  ?LABPROT  --   --  12.0  ?INR  --   --  0.9  ?CREATININE 1.48* 1.33* 1.40*  ?CKTOTAL  --  96  --   ? ? ?Estimated Creatinine Clearance: 53.6 mL/min (A) (by C-G formula based on SCr of 1.4 mg/dL (H)). ? ? ?Medical History: ?Past Medical History:  ?Diagnosis Date  ? Allergy   ? Anxiety   ? Diabetes mellitus   ? Hyperlipidemia   ? Hypertension   ? Hypothyroid   ? Testosterone deficiency   ? ? ? ?Assessment: ?61 y.o. male with PMHs of HTN, HLD, DM, and stroke about 7 years ago status post left carotid endarterectomy who was brought to [facility] ED on 4/30 due to right face tingling.  S/p left ICA angioplasty and stenting.  Pharmacy consulted to manage heparin drip. ? ?Goal of Therapy:  ?Heparin level 0.10-0.25 units/ml ?Monitor platelets by anticoagulation protocol: Yes ?  ?Plan:  ?Start heparin infusion at 600 units/hr ?Check anti-Xa level in 6 hours and daily while on heparin ?Continue to monitor H&H and platelets ? ?Alanda Slim, PharmD, FCCM ?Clinical Pharmacist ?Please see AMION for all Pharmacists' Contact Phone Numbers ?05/31/2021, 3:42 PM  ? ? ? ?

## 2021-06-01 ENCOUNTER — Encounter (HOSPITAL_COMMUNITY): Payer: Self-pay | Admitting: Neuroradiology

## 2021-06-01 DIAGNOSIS — I63232 Cerebral infarction due to unspecified occlusion or stenosis of left carotid arteries: Principal | ICD-10-CM

## 2021-06-01 LAB — BASIC METABOLIC PANEL
Anion gap: 7 (ref 5–15)
BUN: 15 mg/dL (ref 8–23)
CO2: 25 mmol/L (ref 22–32)
Calcium: 9.1 mg/dL (ref 8.9–10.3)
Chloride: 103 mmol/L (ref 98–111)
Creatinine, Ser: 1.3 mg/dL — ABNORMAL HIGH (ref 0.61–1.24)
GFR, Estimated: >60 mL/min (ref >60)
Glucose, Bld: 193 mg/dL — ABNORMAL HIGH (ref 70–99)
Potassium: 4.2 mmol/L (ref 3.5–5.1)
Sodium: 135 mmol/L (ref 135–145)

## 2021-06-01 LAB — COMPREHENSIVE METABOLIC PANEL
ALT: 25 U/L (ref 0–44)
AST: 26 U/L (ref 15–41)
Albumin: 2.9 g/dL — ABNORMAL LOW (ref 3.5–5.0)
Alkaline Phosphatase: 67 U/L (ref 38–126)
Anion gap: 10 (ref 5–15)
BUN: 15 mg/dL (ref 8–23)
CO2: 21 mmol/L — ABNORMAL LOW (ref 22–32)
Calcium: 8.6 mg/dL — ABNORMAL LOW (ref 8.9–10.3)
Chloride: 102 mmol/L (ref 98–111)
Creatinine, Ser: 1.26 mg/dL — ABNORMAL HIGH (ref 0.61–1.24)
GFR, Estimated: >60 mL/min (ref >60)
Glucose, Bld: 314 mg/dL — ABNORMAL HIGH (ref 70–99)
Potassium: 3.8 mmol/L (ref 3.5–5.1)
Sodium: 133 mmol/L — ABNORMAL LOW (ref 135–145)
Total Bilirubin: 0.4 mg/dL (ref 0.3–1.2)
Total Protein: 5.3 g/dL — ABNORMAL LOW (ref 6.5–8.1)

## 2021-06-01 LAB — CBC
HCT: 38.2 % — ABNORMAL LOW (ref 39.0–52.0)
Hemoglobin: 12.9 g/dL — ABNORMAL LOW (ref 13.0–17.0)
MCH: 29.4 pg (ref 26.0–34.0)
MCHC: 33.8 g/dL (ref 30.0–36.0)
MCV: 87 fL (ref 80.0–100.0)
Platelets: 283 10*3/uL (ref 150–400)
RBC: 4.39 MIL/uL (ref 4.22–5.81)
RDW: 12.4 % (ref 11.5–15.5)
WBC: 7.5 10*3/uL (ref 4.0–10.5)
nRBC: 0 % (ref 0.0–0.2)

## 2021-06-01 LAB — GLUCOSE, CAPILLARY
Glucose-Capillary: 364 mg/dL — ABNORMAL HIGH (ref 70–99)
Glucose-Capillary: 237 mg/dL — ABNORMAL HIGH (ref 70–99)
Glucose-Capillary: 384 mg/dL — ABNORMAL HIGH (ref 70–99)
Glucose-Capillary: 400 mg/dL — ABNORMAL HIGH (ref 70–99)
Glucose-Capillary: 305 mg/dL — ABNORMAL HIGH (ref 70–99)

## 2021-06-01 LAB — GLUCOSE, RANDOM: Glucose, Bld: 413 mg/dL — ABNORMAL HIGH (ref 70–99)

## 2021-06-01 LAB — POCT ACTIVATED CLOTTING TIME
Activated Clotting Time: 155 seconds
Activated Clotting Time: 275 seconds

## 2021-06-01 LAB — HEMOGLOBIN A1C
Hgb A1c MFr Bld: >15.5 % — ABNORMAL HIGH (ref 4.8–5.6)
Mean Plasma Glucose: >398 mg/dL

## 2021-06-01 LAB — MAGNESIUM: Magnesium: 1.5 mg/dL — ABNORMAL LOW (ref 1.7–2.4)

## 2021-06-01 MED ORDER — HEPARIN (PORCINE) 25000 UT/250ML-% IV SOLN: 700.0000 [IU]/h | INTRAVENOUS | Status: DC

## 2021-06-01 MED ORDER — LISINOPRIL 20 MG PO TABS
40.0000 mg | ORAL_TABLET | Freq: Every day | ORAL | Status: DC
  Administered 2021-06-01: 40 mg via ORAL
  Filled 2021-06-01: qty 2

## 2021-06-01 MED ORDER — LEVETIRACETAM 500 MG PO TABS: 500.0000 mg | ORAL_TABLET | Freq: Two times a day (BID) | ORAL | Status: DC

## 2021-06-01 MED ORDER — ROSUVASTATIN CALCIUM 20 MG PO TABS: 20.0000 mg | ORAL_TABLET | Freq: Every day | ORAL | 1 refills | Status: AC

## 2021-06-01 MED ORDER — MAGNESIUM SULFATE 2 GM/50ML IV SOLN
2.0000 g | Freq: Once | INTRAVENOUS | Status: AC
  Administered 2021-06-01: 2 g via INTRAVENOUS
  Filled 2021-06-01: qty 50

## 2021-06-01 MED ORDER — ENOXAPARIN SODIUM 40 MG/0.4ML IJ SOSY: 40.0000 mg | PREFILLED_SYRINGE | INTRAMUSCULAR | Status: DC

## 2021-06-01 MED ORDER — INSULIN PEN NEEDLE 31G X 8 MM MISC
3.0000 | Freq: Three times a day (TID) | 2 refills | Status: AC
  Filled 2021-06-02: qty 100, 30d supply, fill #0
  Filled 2021-07-08: qty 100, 30d supply, fill #1

## 2021-06-01 MED ORDER — INSULIN GLARGINE-YFGN 100 UNIT/ML ~~LOC~~ SOLN: 8.0000 [IU] | Freq: Every day | SUBCUTANEOUS | 11 refills | Status: AC

## 2021-06-01 MED ORDER — INSULIN ASPART 100 UNIT/ML IJ SOLN: 3.0000 [IU] | Freq: Three times a day (TID) | INTRAMUSCULAR | 11 refills | Status: AC

## 2021-06-01 MED ORDER — CLOPIDOGREL BISULFATE 75 MG PO TABS: 75.0000 mg | ORAL_TABLET | Freq: Every day | ORAL | 3 refills | Status: AC

## 2021-06-01 MED ORDER — INSULIN ASPART 100 UNIT/ML FLEXPEN: PEN_INJECTOR | SUBCUTANEOUS | 11 refills | Status: AC

## 2021-06-01 MED ORDER — INSULIN GLARGINE-YFGN 100 UNIT/ML ~~LOC~~ SOLN
8.0000 [IU] | Freq: Every day | SUBCUTANEOUS | Status: DC
  Administered 2021-06-01: 8 [IU] via SUBCUTANEOUS
  Filled 2021-06-01: qty 0.08

## 2021-06-01 MED ORDER — LEVETIRACETAM 500 MG PO TABS: 500.0000 mg | ORAL_TABLET | Freq: Two times a day (BID) | ORAL | 3 refills | Status: AC

## 2021-06-01 MED ORDER — BLOOD GLUCOSE MONITOR KIT: PACK | 0 refills | Status: AC

## 2021-06-01 MED ORDER — INSULIN ASPART 100 UNIT/ML IJ SOLN: 3.0000 [IU] | Freq: Three times a day (TID) | INTRAMUSCULAR | Status: DC

## 2021-06-01 MED ORDER — INSULIN ASPART 100 UNIT/ML IJ SOLN
5.0000 [IU] | Freq: Once | INTRAMUSCULAR | Status: AC
  Administered 2021-06-01: 5 [IU] via SUBCUTANEOUS

## 2021-06-01 MED ORDER — LISINOPRIL 40 MG PO TABS: 40.0000 mg | ORAL_TABLET | Freq: Every day | ORAL | 2 refills | Status: AC

## 2021-06-01 NOTE — TOC Transition Note (Signed)
Transition of Care (TOC) - CM/SW Discharge Note ? ? ?Patient Details  ?Name: Hunter Fields ?MRN: [id number] ?Date of Birth: 02-04-1960 ? ?Transition of Care [facility]) CM/SW Contact:  ?Ella Bodo, RN ?Phone Number: ?06/01/2021, 2:11 PM ? ? ?Clinical Narrative:    ?Patient for possible discharge home with spouse, who is at bedside.  Patient is uninsured and working; states he will be eligible for health insurance on July 1.  Patient has been scheduled for a PCP follow up appt at [facility] on 06/06/2021, and information is on AVS.  Pt is uninsured, but is eligible for medication assistance through [place] letter given with explanation of program benefits.   ?He and his wife are appreciative of assistance.  ? ? ?Final next level of care: Home/Self Care ?Barriers to Discharge: Barriers Resolved ? ? ?Patient Goals and CMS Choice ?Patient states their goals for this hospitalization and ongoing recovery are:: to go home ?  ?  ? ?  ?           ?  ?  ?  ?  ? ?Discharge Plan and Services ?  ?Discharge Planning Services: CM Consult, Medication Assistance, Lapel Program, Follow-up appt scheduled ?           ?  ?  ?  ?  ?  ?  ?  ?  ?  ?  ? ?Social Determinants of Health (SDOH) Interventions ?  ? ? ?Readmission Risk Interventions ?   ? View : No data to display.  ?  ?  ?  ? ?Reinaldo Raddle, RN, BSN  ?Trauma/Neuro ICU Case Manager ?[phone] ? ? ? ? ?

## 2021-06-01 NOTE — Progress Notes (Signed)
? ? ?Referring Physician(s): Lavera Guise.  ? ?Supervising Physician: Pedro Earls ? ?Patient Status:  [facility] - In-pt ? ?Chief Complaint: ? ?Left ICA stenosis, s/p angioplasty and stent placement via right RA approach by Dr. Karenann Cai on 05/31/21. ? ?Subjective: ? ?Patient seen with  Dr. Karenann Cai, patient sitting in bed, NAD. RN at bedside.  ?States that he is doing great, wants to go home.  ?Importance of DAPT was discussed with the patient, strongly encouraged the patient to contact NIR if DAPT needs to be interrupted for any reasons.  ?Patient verbalized understanding.  ? ?RN raised concern about patient's blood pressure as SBP was over 150. ?Ok to d/c Cleviprex, patient to start taking home HTN med lisinopril 40 mg qd and keep SBP lower than 160.  ?Encouraged patient to follow up with PCP for HTN management.  ?Patient verbalized understanding.  ? ?Allergies: ?Food ? ?Medications: ?Prior to Admission medications   ?Medication Sig Start Date End Date Taking? Authorizing Provider  ?aspirin 81 MG EC tablet Take 81 mg by mouth daily.   Yes [provider]  ?diazepam (VALIUM) 10 MG tablet Take 1/2 to 1 tablet by mouth 4 times daily for tremor. ?Patient taking differently: Take 10 mg by mouth every 6 (six) hours as needed for anxiety. 03/11/15  Yes Unk Pinto, MD  ?metFORMIN (GLUCOPHAGE) 1000 MG tablet Take 1,000 mg by mouth 2 (two) times daily with a meal.   Yes [provider]  ?Multiple Vitamin (MULTIVITAMIN) tablet Take 1 tablet by mouth daily.     Yes [provider]  ?fenofibrate micronized (LOFIBRA) 134 MG capsule TAKE 1 CAPSULE DAILY FOR BLOOD FATS ?Patient not taking: Reported on 05/29/2021 02/10/14   Unk Pinto, MD  ?glyBURIDE (DIABETA) 5 MG tablet Takes 1/2 to 1 tab po BID-TID daily for diabetes ?Patient not taking: Reported on 05/29/2021 10/20/13   Unk Pinto, MD  ?HYDROcodone-acetaminophen [facility]) 5-325 MG per tablet Take 1/2 to 1 tablet  every 3 to 4 hours is needed for cough or pain ?Patient not taking: Reported on 05/29/2021 02/04/14   Unk Pinto, MD  ?levothyroxine (SYNTHROID) 75 MCG tablet Take 1 tablet (75 mcg total) by mouth daily. ?Patient not taking: Reported on 05/29/2021 02/10/14 02/10/15  Unk Pinto, MD  ?lisinopril (PRINIVIL,ZESTRIL) 40 MG tablet Take 1 tablet (40 mg total) by mouth daily. ?Patient not taking: Reported on 05/29/2021 02/10/14   Unk Pinto, MD  ?losartan-hydrochlorothiazide [facility]) 100-25 MG per tablet Take 1 tablet by mouth daily. ?Patient not taking: Reported on 05/29/2021 02/11/14 07/12/15  Unk Pinto, MD  ?metFORMIN (GLUCOPHAGE-XR) 500 MG 24 hr tablet TAKE 2 TABLETS TWICE A DAY FOR DIABETES MELLITUS ?Patient not taking: Reported on 05/29/2021 02/10/14   Unk Pinto, MD  ?testosterone cypionate (DEPOTESTOSTERONE CYPIONATE) 200 MG/ML injection inject 2 ml's intramuscularly every 2 weeks as directed by physician & 20m syringes and 1" 21 G needles ?Patient not taking: Reported on 05/29/2021 08/24/14 02/24/15  MUnk Pinto MD  ? ? ? ?Vital Signs: ?BP (!) 153/87   Pulse 72   Temp 98.1 ?F (36.7 ?C) (Oral)   Resp (!) 22   Ht '5\' 8"'$  (1.727 m)   Wt 157 lb 3 oz (71.3 kg)   SpO2 98%   BMI 23.90 kg/m?  ? ?Physical Exam ?Vitals reviewed.  ?Constitutional:   ?   Appearance: Normal appearance.  ?HENT:  ?   Head: Normocephalic and atraumatic.  ?Cardiovascular:  ?   Rate and Rhythm: Normal rate  and regular rhythm.  ?   Comments: Right RA pulse 2+ ?Pulmonary:  ?   Effort: Pulmonary effort is normal.  ?Abdominal:  ?   General: Abdomen is flat. Bowel sounds are normal.  ?   Palpations: Abdomen is soft.  ?Skin: ?   General: Skin is warm and dry.  ?   Coloration: Skin is not jaundiced or pale.  ?   Comments: Positive dressing on right wrist puncture site. Site is unremarkable with no erythema, edema, tenderness, bleeding or drainage. Dressing clean, dry, and intact.  ?  ?Neurological:  ?   Mental Status: He is alert  and oriented to person, place, and time.  ?Psychiatric:     ?   Mood and Affect: Mood normal.     ?   Behavior: Behavior normal.     ?   Judgment: Judgment normal.  ? ? ?Imaging: ?CT ANGIO HEAD NECK W WO CM ? ?Result Date: 05/30/2021 ?CLINICAL DATA:  Provided history: Stroke/TIA, determine embolic source. EXAM: CT ANGIOGRAPHY HEAD AND NECK TECHNIQUE: Multidetector CT imaging of the head and neck was performed using the standard protocol during bolus administration of intravenous contrast. Multiplanar CT image reconstructions and MIPs were obtained to evaluate the vascular anatomy. Carotid stenosis measurements (when applicable) are obtained utilizing NASCET criteria, using the distal internal carotid diameter as the denominator. RADIATION DOSE REDUCTION: This exam was performed according to the departmental dose-optimization program which includes automated exposure control, adjustment of the mA and/or kV according to patient size and/or use of iterative reconstruction technique. CONTRAST:  8m OMNIPAQUE IOHEXOL 350 MG/ML SOLN COMPARISON:  MRI brain 05/29/2021. MRA head 05/30/2021. FINDINGS: CT HEAD FINDINGS Brain: No age-advanced or lobar predominant parenchymal atrophy. Multiple acute/early subacute cortical and subcortical infarcts within the left frontal, parietal, occipital and posterior temporal lobes, some of which were better appreciated on the recent prior brain MRI of 05/29/2021. Redemonstrated chronic cortical/subcortical infarct within the posterosuperior left temporal lobe, left temporoparietal junction and left insula. Background mild patchy and ill-defined hypoattenuation within the cerebral white matter, nonspecific but compatible with chronic small vessel ischemic disease. There is no acute intracranial hemorrhage. No extra-axial fluid collection. No evidence of an intracranial mass. No midline shift. Vascular: No hyperdense vessel.  Atherosclerotic calcifications. Skull: Normal. Negative for  fracture or focal lesion. Sinuses: Minimal mucosal thickening within the bilateral ethmoid sinuses. Orbits: No orbital mass or acute orbital finding. Review of the MIP images confirms the above findings CTA NECK FINDINGS Aortic arch: Standard aortic branching. Atherosclerotic plaque within the visualized aortic arch and proximal major branch vessels of the neck. No hemodynamically significant innominate or proximal subclavian artery stenosis. Right carotid system: CCA and ICA patent within the neck without hemodynamically significant stenosis (50% or greater). Mild atherosclerotic plaque within the CCA, about the carotid bifurcation and within the cervical ICA. Left carotid system: Prior carotid endarterectomy. CCA and ICA patent within the neck. Atherosclerotic plaque within the CCA, about the carotid bifurcation and within the proximal to mid cervical ICA. Most notably, soft plaque results in severe, near occlusive stenosis of the proximal ICA (greater than 90%) (series 11, image 217). Additionally, there are foci of ulcerated plaque within the distal CCA and carotid bulb. Vertebral arteries: Vertebral arteries patent within the neck. Nonstenotic atherosclerotic plaque at the origin of the dominant right vertebral artery. Soft plaque results in moderate/severe stenosis at the origin of the non dominant left vertebral artery. Skeleton: No acute fracture or aggressive osseous lesion. Cervical spondylosis Other neck: No  neck mass or cervical lymphadenopathy. Subcentimeter thyroid nodules, not meeting consensus criteria for ultrasound follow-up based on size. Upper chest: No consolidation within the imaged lung apices. Review of the MIP images confirms the above findings CTA HEAD FINDINGS Anterior circulation: The intracranial internal carotid arteries are patent. Atherosclerotic plaque within both vessels with more than mild stenosis. The M1 middle cerebral arteries are patent. Atherosclerotic irregularity of the M2  and more distal MCA vessels, bilaterally. No M2 proximal branch occlusion or high-grade proximal stenosis is identified. The anterior cerebral arteries are patent. 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 P

## 2021-06-01 NOTE — Progress Notes (Signed)
STROKE TEAM PROGRESS NOTE  ? ?INTERVAL HISTORY ?No family is at the bedside.  Patient sitting in chair, awake alert, orientated.  Still has mild right hand weakness but otherwise neuro unchanged.  Had left ICA stenting yesterday, successful.  Likely to be discharged today. ? ?Vitals:  ? 06/01/21 1000 06/01/21 1100 06/01/21 1200 06/01/21 1600  ?BP: 125/83 108/73    ?Pulse: 75 85 82   ?Resp: 15 (!) 25 20   ?Temp:   97.9 ?F (36.6 ?C) 98.1 ?F (36.7 ?C)  ?TempSrc:   Oral Oral  ?SpO2: 98% 99% 97%   ?Weight:      ?Height:      ? ?CBC:  ?Recent Labs  ?Lab 05/29/21 ?1246 05/29/21 ?2358 05/31/21 ?0256 06/01/21 ?4580  ?WBC 6.5   < > 6.8 7.5  ?NEUTROABS 4.8  --   --   --   ?HGB 13.5   < > 13.3 12.9*  ?HCT 40.5   < > 40.4 38.2*  ?MCV 87.5   < > 88.0 87.0  ?PLT 278   < > 290 283  ? < > = values in this interval not displayed.  ? ?Basic Metabolic Panel:  ?Recent Labs  ?Lab 05/29/21 ?2358 05/31/21 ?0256 06/01/21 ?0458 06/01/21 ?1456  ?NA 138   < > 133* 135  ?K 3.8   < > 3.8 4.2  ?CL 103   < > 102 103  ?CO2 24   < > 21* 25  ?GLUCOSE 88   < > 314* 193*  ?BUN 26*   < > 15 15  ?CREATININE 1.33*   < > 1.26* 1.30*  ?CALCIUM 9.3   < > 8.6* 9.1  ?MG 1.6*  --  1.5*  --   ? < > = values in this interval not displayed.  ? ?Lipid Panel:  ?Recent Labs  ?Lab 05/29/21 ?2358  ?CHOL 249*  ?TRIG 782*  ?HDL 30*  ?CHOLHDL 8.3  ?VLDL UNABLE TO CALCULATE IF TRIGLYCERIDE OVER 400 mg/dL  ?LDLCALC UNABLE TO CALCULATE IF TRIGLYCERIDE OVER 400 mg/dL  ? ?HgbA1c:  ?Recent Labs  ?Lab 05/29/21 ?2358  ?HGBA1C >15.5*  ? ? ?IMAGING past 24 hours ?No results found. ? ?PHYSICAL EXAM ? ?Physical Exam  ?Constitutional: Appears well-developed and well-nourished.  ?Cardiovascular: Normal rate and regular rhythm.  ?Respiratory: Effort normal, non-labored breathing ? ?Neuro: ?Mental Status: ?Patient is awake, alert, oriented to person, place, month, year, and situation. ?Patient is able to give a clear and coherent history. ?No signs of aphasia or neglect ?Cranial  Nerves: ?II: Visual Fields are full. Pupils are equal, round, and reactive to light.   ?III,IV, VI: EOMI without ptosis or diploplia.  ?V: Facial sensation is symmetric to temperature ?VII: Facial movement is symmetric resting and smiling ?VIII: Hearing is intact to voice ?X: Palate elevates symmetrically ?XI: Shoulder shrug is symmetric. ?XII: Tongue protrudes midline without atrophy or fasciculations.  ?Motor: ?Tone is normal. Bulk is normal. 5/5 strength was present in all four extremities except left hand grip 4/5, decreased dexterity.  ?Sensory: ?Sensation is symmetric to light touch and temperature in the arms and legs. No extinction to DSS present.  ?Deep Tendon Reflexes: ?2+ and symmetric in the biceps and patellae.  ?Plantars: ?Toes are downgoing bilaterally.  ?Cerebellar: ?Left FNF and bilateral HKS are intact bilaterally.  Right finger-to-nose ataxic. ? ?  ? ?ASSESSMENT/PLAN ?Mr. Hunter Fields is a 61 y.o. male with history of diabetes mellitus, hypertension, history of stroke about 7 years ago status post left  carotid endarterectomy presenting with right-sided facial twitching and right hand numbness and tingling.  He does have some residual right nasolabial fold flattening from his previous stroke. ? ?Stroke:  left scattered patchy infarct likely secondary high-grade stenosis left ICA ?MRI  Acute and/or subacute infarcts in the left frontal, occipital, and parietal cortex and white matter.  ?MRA  No branch occlusion or flow limiting stenosis in the left MCA circulation. Intracranial atherosclerosis, most notably a severe stenosis or occlusion of the left vertebral artery beyond the PICA. ?CTA head and neck prior left carotid endarterectomy, soft plaque results in a severe, near occlusive focal stenosis in the proximal left ICA.  There are ulcerated plaque within the distal left CCA and the left carotid bulb.  Nondominant left VA occluded beyond left PICA origin. ?2D Echo EF 45 to 50% ?EEG normal ?LDL  82 ?HgbA1c > 15.5 ?VTE prophylaxis -SCDs ?aspirin 81 mg daily prior to admission, now on aspirin 81 mg daily and clopidogrel 75 mg daily post carotid stenting. ?Therapy recommendations: Outpatient OT ?Disposition: Pending ? ?Left ICA stenosis ?Status post left CEA 7 years ago ?This admission CTA head and neck showed soft plaque resulting in severe, near occlusive focal stenosis in the proximal ICA beyond ICA bulb ?S/p left ICA stenting 5/2 - doing well ?On ASA and plavix ? ?Seizure like activity ?Concerning for left facial twitching  ?But pt stated that he just had left eyelid twitching and  he does not think he had seizure.  ?EEG normal limits ?Was loaded with Keppra, now on Keppra 500 twice daily ? ?Hypertension ?Home meds: Losartan, HCTZ, lisinopril ?Stable ?Avoid low BP ?BP goal normotensive 24h after ICA stenting ? ?Hyperlipidemia ?Home meds: None ?LDL 82, goal < 70 ?Add Crestor 20 ?Continue statin at discharge ? ?Diabetes type II, uncontrolled ?Home meds: Metformin ?HgbA1c > 15.5 goal < 7.0 ?CBGs ?SSI ?Strict glucose control and home ?Close PCP follow-up  ? ?Tobacco abuse ?Current smoker ?Smoking cessation counseling provided ?Pt is willing to quit ? ?Other Stroke Risk Factors ?Advanced Age >/= 99  ?Hx stroke/TIA 7 years ago in [place], [place], status post left CEA endarterectomy ? ?Other Active Problems ?TG 782 - hypertriglyceridemia ? ?Hospital day # 2 ? ?Neurology will sign off. Please call with questions. Pt will follow up with stroke clinic NP at [facility] in about 4 weeks. Thanks for the consult. ? ? ?Rosalin Hawking, MD PhD ?Stroke Neurology ?06/01/2021 ?5:24 PM ? ? ?To contact Stroke Continuity provider, please refer to [email or web address] ?After hours, contact General Neurology ? ?

## 2021-06-01 NOTE — Progress Notes (Signed)
Inpatient Diabetes Program Recommendations ? ?AACE/ADA: New Consensus Statement on Inpatient Glycemic Control (2015) ? ?Target Ranges:  Prepandial:   less than 140 mg/dL ?     Peak postprandial:   less than 180 mg/dL (1-2 hours) ?     Critically ill patients:  140 - 180 mg/dL  ? ?Lab Results  ?Component Value Date  ? GLUCAP 237 (H) 06/01/2021  ? HGBA1C 7.1 (H) 04/02/2014  ? ? ?Review of Glycemic Control ? Latest Reference Range & Units 05/31/21 19:11 05/31/21 22:13 05/31/21 22:17 05/31/21 23:27 06/01/21 03:22 06/01/21 07:57  ?Glucose-Capillary 70 - 99 mg/dL 188 (H) 444 (H) 401 (H) 397 (H) 364 (H) 237 (H)  ?(H): Data is abnormally high ? ?Diabetes history: DM2 ?Outpatient Diabetes medications: Metformin 1000 mg BID, Glyburide 2.5 BID (not taking) ?Current orders for Inpatient glycemic control: Semglee 8 units (increased today from 5 units, Novolog 0-15 units TID and 0-5 units QHS ? ?Inpatient Diabetes Program Recommendations:   ? ?Noted Semglee increased this morning.  Please also consider: ? ?Novolog 3 units TID with meals if eats at least 50%. ? ?Will continue to follow while inpatient. ? ?Thank you, ?Reche Dixon, MSN, RN ?Diabetes Coordinator ?Inpatient Diabetes Program ?[phone] (team pager from 8a-5p) ? ? ? ?

## 2021-06-01 NOTE — Progress Notes (Signed)
ANTICOAGULATION CONSULT NOTE  ? ?Pharmacy Consult for Heparin ?Indication:  post-neuro interventional radiology ? ?Allergies  ?Allergen Reactions  ? Food Swelling  ?  MUSHROOMS=SWELLING  ? ? ?Patient Measurements: ?Height: '5\' 8"'$  (172.7 cm) ?Weight: 71.3 kg (157 lb 3 oz) ?IBW/kg (Calculated) : 68.4 ? ? ?Vital Signs: ?Temp: 98.2 ?F (36.8 ?C) (05/03 0000) ?Temp Source: Oral (05/03 0000) ?BP: 119/74 (05/03 0030) ?Pulse Rate: 76 (05/03 0030) ? ?Labs: ?Recent Labs  ?  05/29/21 ?1246 05/29/21 ?2358 05/31/21 ?0256 05/31/21 ?2225  ?HGB 13.5 14.5 13.3  --   ?HCT 40.5 42.7 40.4  --   ?PLT 278 302 290  --   ?LABPROT  --   --  12.0  --   ?INR  --   --  0.9  --   ?HEPARINUNFRC  --   --   --  <0.10*  ?CREATININE 1.48* 1.33* 1.40*  --   ?CKTOTAL  --  96  --   --   ? ? ? ?Estimated Creatinine Clearance: 53.6 mL/min (A) (by C-G formula based on SCr of 1.4 mg/dL (H)). ? ? ?Medical History: ?Past Medical History:  ?Diagnosis Date  ? Allergy   ? Anxiety   ? Diabetes mellitus   ? Hyperlipidemia   ? Hypertension   ? Hypothyroid   ? Testosterone deficiency   ? ? ? ?Assessment: ?61 y.o. male with PMHs of HTN, HLD, DM, and stroke about 7 years ago status post left carotid endarterectomy who was brought to [facility] ED on 4/30 due to right face tingling.  S/p left ICA angioplasty and stenting.  Pharmacy consulted to manage heparin drip. ? ?Initial heparin level undetectable: <0.10, no issues with infusion or s/sx of bleeding reported - will increase conservatively without bolus  ? ? ?Goal of Therapy:  ?Heparin level 0.10-0.25 units/ml ?Monitor platelets by anticoagulation protocol: Yes ?  ?Plan:  ?Start heparin infusion at 700 units/hr until 0700 ?Check anti-Xa level in 6 hours and daily while on heparin ?Continue to monitor H&H and platelets ? ?Georga Bora, PharmD ?Clinical Pharmacist ?06/01/2021 12:39 AM ?Please check AMION for all [place] numbers ? ? ? ? ?

## 2021-06-01 NOTE — Progress Notes (Signed)
Occupational Therapy Treatment ?Patient Details ?Name: Hunter Fields ?MRN: [id number] ?DOB: [birth year] ?Today's Date: 06/01/2021 ? ? ?History of present illness pt is a 61 y/o male admitted to ED with Right-sided facial twitching along with right handed numbness and tingling.  MRI shows left side acute to subacute infarcts in the frontal, parietal and occipital lobes and white matter.  EEG normal.  PMHx:DM, HLD, HTN, anxiety ?  ?OT comments ? Patient up in the recliner, hoping to return home this afternoon.  Continues to progress with right hand/arm coordination, strength, and less numbness reported.  No difficulties with mobility this am to the recliner, and able to eat/light grooming with no assist.  No further OT needs in the acute setting, no post acute OT anticipated.  Patient understands outpatient is an option should he determine a need.    ? ?Recommendations for follow up therapy are one component of a multi-disciplinary discharge planning process, led by the attending physician.  Recommendations may be updated based on patient status, additional functional criteria and insurance authorization. ?   ?Follow Up Recommendations ? No OT follow up  ?  ?Assistance Recommended at Discharge PRN  ?Patient can return home with the following ? Assist for transportation ?  ?Equipment Recommendations ? None recommended by OT  ?  ?Recommendations for Other Services   ? ?  ?Precautions / Restrictions Precautions ?Precautions: None ?Restrictions ?Weight Bearing Restrictions: No  ? ? ?  ? ?Mobility Bed Mobility ?  ?  ?  ?  ?  ?  ?  ?General bed mobility comments: up in recliner ?  ? ?Transfers ?Overall transfer level: Modified independent ?  ?  ?  ?  ?  ?  ?  ?  ?General transfer comment: pushing IV pole ?  ?  ?Balance Overall balance assessment: No apparent balance deficits (not formally assessed) ?  ?  ?  ?  ?  ?  ?  ?  ?  ?  ?  ?  ?  ?  ?  ?  ?  ?  ?   ? ?ADL either performed or assessed with clinical judgement  ? ?ADL  Overall ADL's : Modified independent ?  ?  ?  ?  ?  ?  ?  ?  ?  ?  ?  ?  ?  ?  ?  ?  ?  ?  ?  ?  ?  ? ?Extremity/Trunk Assessment Upper Extremity Assessment ?RUE Deficits / Details: numbness in R hand less per patient and strength improving.  Able to use the R UE in all functional tasks ?RUE Coordination: decreased fine motor ?  ?Lower Extremity Assessment ?Lower Extremity Assessment: Defer to PT evaluation ?  ?Cervical / Trunk Assessment ?Cervical / Trunk Assessment: Normal ?  ? ?Vision Patient Visual Report: No change from baseline ?  ?  ?Perception Perception ?Perception: Within Functional Limits ?  ?Praxis Praxis ?Praxis: Intact ?  ? ?Cognition Arousal/Alertness: Awake/alert ?Behavior During Therapy: [facility] for tasks assessed/performed ?Overall Cognitive Status: Within Functional Limits for tasks assessed ?  ?  ?  ?  ?  ?  ?  ?  ?  ?  ?  ?  ?  ?  ?  ?  ?  ?  ?  ?   ?Exercises  Squeeze ball and FMC exercises ? ?  ?Shoulder Instructions   ? ? ?  ?General Comments    ? ? ?Pertinent Vitals/ Pain  Pain Assessment ?Pain Assessment: No/denies pain ? ?   ?  ?  ?  ?  ?  ?  ?  ?  ?  ?  ?  ?  ?  ?  ?  ?  ?  ?  ? ?  ?    ?  ?  ?  ?   ? ?Frequency ?    ? ? ? ? ?  ?Progress Toward Goals ? ?OT Goals(current goals can now be found in the care plan section) ? Progress towards OT goals: Progressing toward goals ? ?Acute Rehab OT Goals ?Time For Goal Achievement: 06/13/21 ?Potential to Achieve Goals: Good  ?Plan All goals met and education completed, patient discharged from OT services   ? ?Co-evaluation ? ? ?   ?  ?  ?  ?  ? ?  ?AM-PAC OT "6 Clicks" Daily Activity     ?Outcome Measure ? ? Help from another person eating meals?: None ?Help from another person taking care of personal grooming?: None ?Help from another person toileting, which includes using toliet, bedpan, or urinal?: None ?Help from another person bathing (including washing, rinsing, drying)?: None ?Help from another person to put on and taking off regular upper  body clothing?: None ?Help from another person to put on and taking off regular lower body clothing?: None ?6 Click Score: 24 ? ?  ?End of Session   ? ?OT Visit Diagnosis: Muscle weakness (generalized) (M62.81) ?  ?Activity Tolerance Patient tolerated treatment well ?  ?Patient Left in chair;with call bell/phone within reach ?  ?Nurse Communication Mobility status ?  ? ?   ? ?Time: 0945-1000 ?OT Time Calculation (min): 15 min ? ?Charges: OT General Charges ?$OT Visit: 1 Visit ?OT Treatments ?$Self Care/Home Management : 8-22 mins ? ?06/01/2021 ? ?RP, OTR/L ? ?Acute Rehabilitation Services ? ?Office:  [phone] ? ? ?Avalyn Molino D [place] ?06/01/2021, 10:06 AM ?

## 2021-06-01 NOTE — Progress Notes (Incomplete)
? ? ? [facility] ?                                                                            ? ? ?Hunter Fields, is a 61 y.o. male, DOB - Nov 29, 1960, DPO:[id number] ?Admit date - 05/29/2021    ?Outpatient Primary MD for the patient is Pcp, No ? ?LOS - 2  days ? ? ? ?Brief summary  ? ?61 y.o. male with a past medical history of diabetes mellitus, hypertension, history of stroke about 7 years ago status post left carotid endarterectomy who was in his usual state of health till about 11:30 on the morning of admission.  He had also noted some numbness in his right hand previously.  Initially there was concern for seizure activity.  Patient was loaded with Keppra.  Subsequently MRI showed acute infarcts.  Neurology is following. ? ? ?Assessment & Plan  ? ? ?Assessment and Plan: ? ? ? ?ACUTE CVA in the left hemisphere:  ?MRI brain shows acute infarcts in the left hemisphere.  ?Neurology on board.  ?LDL is 82, Hemoglobin A1c is  ? ?EEG does not show any epileptiform activity.  ?MRA no branch occlusion in the left MCA circulation.  ?CTA head and neck prior left carotid endarterectomy, soft plaque results in a severe, near occlusive focal stenosis in the proximal left ICA. ?Pt currently on aspirin, plavix, crestor and keppra.  ?IR consulted and pt underwent Cerebral angiogram and left ICA stenting with cerebral protection device.  ?Therapy eval recommending Outpatient PT/OT.  ? ? ? ?Seizure like activity:  ?Currently on keppra.  ? ? ?Left ICA stenosis.  ?S/p left CEA 7 years ago.  ?IR consulted and underwent ICA stenting.  ? ? ?Hypertension;  ?Continue with lisinopril.  ? ? ?Hyperlipidemia:  ?Continue with crestor.  ? ? ?DM type 2  uncontrolled with hyperglycemia.  ?Non insulin dependent.  ?CBG (last 3)  ?Recent Labs  ?  05/31/21 ?2327 06/01/21 ?4315 06/01/21 ?0757  ?LaneA1c is pending.  ?Continue with SSI.  ?Started on semglee 8 units daily.  ? ? ? ? ?Estimated body mass index is 23.9 kg/m? as  calculated from the following: ?  Height as of this encounter: _0  (1.727 m). ?  Weight as of this encounter: 71.3 kg. ? ?Code Status: full code.  ?DVT Prophylaxis:  enoxaparin (LOVENOX) injection 40 mg Start: 05/30/21 1000 ? ? ?Level of Care: Level of care: ICU ?Family Communication: None at bedside. ? ?Disposition Plan:     Remains inpatient appropriate:  further work up  ? ?Procedures:  ?Echocardiogram ?EEG ? ?Consultants:   ?IR ?Neurology. ? ?Antimicrobials:  ? ?Anti-infectives (From admission, onward)  ? ? None  ? ?  ? ? ? ?Medications ? ?Scheduled Meds: ? aspirin  325 mg Oral Daily  ? Or  ? aspirin  324 mg Per Tube Daily  ? chlorhexidine gluconate (MEDLINE KIT)  15 mL Mouth Rinse BID  ? Chlorhexidine Gluconate Cloth  6 each Topical Daily  ? clopidogrel  75 mg Oral Daily  ? Or  ? clopidogrel  75 mg Per Tube Daily  ? enoxaparin (LOVENOX) injection  40 mg Subcutaneous Q24H  ?  insulin aspart  0-15 Units Subcutaneous TID WC  ? insulin aspart  0-5 Units Subcutaneous QHS  ? insulin glargine-yfgn  5 Units Subcutaneous Daily  ? nitroGLYCERIN  100 mcg Intra-arterial to XRAY  ? rosuvastatin  20 mg Oral Daily  ? ?Continuous Infusions: ? clevidipine Stopped (06/01/21 0142)  ? levETIRAcetam Stopped (05/31/21 2246)  ? magnesium sulfate bolus IVPB    ? ?PRN Meds:.acetaminophen **OR** acetaminophen (TYLENOL) oral liquid 160 mg/5 mL **OR** acetaminophen, acetaminophen **OR** acetaminophen, diazepam, ondansetron **OR** ondansetron (ZOFRAN) IV, ondansetron (ZOFRAN) IV ? ? ? ?Subjective:  ? ?Hunter Fields was seen and examined today.  No new complaints.  ? ?Objective:  ? ?Vitals:  ? 06/01/21 0630 06/01/21 0645 06/01/21 0700 06/01/21 0800  ?BP: 136/83 118/81 132/90   ?Pulse: 68 67 69   ?Resp: _0 ?Temp:    98.1 ?F (36.7 ?C)  ?TempSrc:    Oral  ?SpO2: 95% 95% 98%   ?Weight:      ?Height:      ? ? ?Intake/Output Summary (Last 24 hours) at 06/01/2021 0849 ?Last data filed at 06/01/2021 0800 ?Gross per 24 hour  ?Intake 1422.31 ml   ?Output 2200 ml  ?Net -777.69 ml  ? ?Filed Weights  ? 05/29/21 2300  ?Weight: 71.3 kg  ? ? ? ?Exam ?General exam: Appears calm and comfortable  ?Respiratory system: Clear to auscultation. Respiratory effort normal. ?Cardiovascular system: S1 & S2 heard, RRR. No JVD, murmurs, rubs, gallops or clicks. No pedal edema. ?Gastrointestinal system: Abdomen is nondistended, soft and nontender. No organomegaly or masses felt. Normal bowel sounds heard. ?Central nervous system: Alert and oriented. No focal neurological deficits. ?Extremities: Symmetric 5 x 5 power. ?Skin: No rashes, lesions or ulcers ?Psychiatry: Judgement and insight appear normal. Mood & affect appropriate.  ? ? ? ?Data Reviewed:  I have personally reviewed following labs and imaging studies ? ? ?CBC ?Lab Results  ?Component Value Date  ? WBC 7.5 06/01/2021  ? RBC 4.39 06/01/2021  ? HGB 12.9 (L) 06/01/2021  ? HCT 38.2 (L) 06/01/2021  ? MCV 87.0 06/01/2021  ? MCH 29.4 06/01/2021  ? PLT 283 06/01/2021  ? MCHC 33.8 06/01/2021  ? RDW 12.4 06/01/2021  ? LYMPHSABS 1.0 05/29/2021  ? MONOABS 0.5 05/29/2021  ? EOSABS 0.1 05/29/2021  ? BASOSABS 0.0 05/29/2021  ? ? ? ?Last metabolic panel ?Lab Results  ?Component Value Date  ? NA 133 (L) 06/01/2021  ? K 3.8 06/01/2021  ? CL 102 06/01/2021  ? CO2 21 (L) 06/01/2021  ? BUN 15 06/01/2021  ? CREATININE 1.26 (H) 06/01/2021  ? GLUCOSE 314 (H) 06/01/2021  ? GFRNONAA >60 06/01/2021  ? GFRAA 78 04/02/2014  ? CALCIUM 8.6 (L) 06/01/2021  ? PROT 5.3 (L) 06/01/2021  ? ALBUMIN 2.9 (L) 06/01/2021  ? BILITOT 0.4 06/01/2021  ? ALKPHOS 67 06/01/2021  ? AST 26 06/01/2021  ? ALT 25 06/01/2021  ? ANIONGAP 10 06/01/2021  ? ? ?CBG (last 3)  ?Recent Labs  ?  05/31/21 ?2327 06/01/21 ?9150 06/01/21 ?0757  ?[place]  ? ? ?Coagulation Profile: ?Recent Labs  ?Lab 05/31/21 ?0256  ?INR 0.9  ? ? ? ?Radiology Studies: ?CT ANGIO HEAD NECK W WO CM ? ?Result Date: 05/30/2021 ?CLINICAL DATA:  Provided history: Stroke/TIA, determine embolic  source. EXAM: CT ANGIOGRAPHY HEAD AND NECK TECHNIQUE: Multidetector CT imaging of the head and neck was performed using the standard protocol during bolus administration of intravenous contrast. Multiplanar CT  image reconstructions and MIPs were obtained to evaluate the vascular anatomy. Carotid stenosis measurements (when applicable) are obtained utilizing NASCET criteria, using the distal internal carotid diameter as the denominator. RADIATION DOSE REDUCTION: This exam was performed according to the departmental dose-optimization program which includes automated exposure control, adjustment of the mA and/or kV according to patient size and/or use of iterative reconstruction technique. CONTRAST:  98m OMNIPAQUE IOHEXOL 350 MG/ML SOLN COMPARISON:  MRI brain 05/29/2021. MRA head 05/30/2021. FINDINGS: CT HEAD FINDINGS Brain: No age-advanced or lobar predominant parenchymal atrophy. Multiple acute/early subacute cortical and subcortical infarcts within the left frontal, parietal, occipital and posterior temporal lobes, some of which were better appreciated on the recent prior brain MRI of 05/29/2021. Redemonstrated chronic cortical/subcortical infarct within the posterosuperior left temporal lobe, left temporoparietal junction and left insula. Background mild patchy and ill-defined hypoattenuation within the cerebral white matter, nonspecific but compatible with chronic small vessel ischemic disease. There is no acute intracranial hemorrhage. No extra-axial fluid collection. No evidence of an intracranial mass. No midline shift. Vascular: No hyperdense vessel.  Atherosclerotic calcifications. Skull: Normal. Negative for fracture or focal lesion. Sinuses: Minimal mucosal thickening within the bilateral ethmoid sinuses. Orbits: No orbital mass or acute orbital finding. Review of the MIP images confirms the above findings CTA NECK FINDINGS Aortic arch: Standard aortic branching. Atherosclerotic plaque within the  visualized aortic arch and proximal major branch vessels of the neck. No hemodynamically significant innominate or proximal subclavian artery stenosis. Right carotid system: CCA and ICA patent within the neck wit

## 2021-06-01 NOTE — Progress Notes (Signed)
D/C education given to Pt and all questions answered. Printed prescription given to Pt and Pt's wife and no equipment to deliver. IV removed. Pt taken to car with all belongings.   ?

## 2021-06-02 ENCOUNTER — Other Ambulatory Visit (HOSPITAL_COMMUNITY): Payer: Self-pay

## 2021-06-02 LAB — HEMOGLOBIN A1C
Hgb A1c MFr Bld: >15.5 % — ABNORMAL HIGH (ref 4.8–5.6)
Mean Plasma Glucose: >398 mg/dL

## 2021-06-02 MED ORDER — ACCU-CHEK GUIDE W/DEVICE KIT
PACK | 0 refills | Status: AC
  Filled 2021-06-02: qty 1, 30d supply, fill #0

## 2021-06-02 MED ORDER — ACCU-CHEK SOFTCLIX LANCETS MISC
0 refills | Status: AC
  Filled 2021-06-02: qty 100, 25d supply, fill #0

## 2021-06-02 MED ORDER — INSULIN ASPART 100 UNIT/ML FLEXPEN
PEN_INJECTOR | SUBCUTANEOUS | 11 refills | Status: AC
  Filled 2021-06-02 – 2021-06-23 (×2): qty 3, 20d supply, fill #0
  Filled 2021-07-08: qty 3, 20d supply, fill #1

## 2021-06-02 MED ORDER — ACCU-CHEK GUIDE VI STRP
ORAL_STRIP | 0 refills | Status: AC
  Filled 2021-06-02: qty 100, 25d supply, fill #0

## 2021-06-03 ENCOUNTER — Encounter: Payer: Self-pay | Admitting: Occupational Therapy

## 2021-06-03 ENCOUNTER — Ambulatory Visit: Payer: Self-pay | Attending: Internal Medicine | Admitting: Occupational Therapy

## 2021-06-03 DIAGNOSIS — R278 Other lack of coordination: Secondary | ICD-10-CM | POA: Insufficient documentation

## 2021-06-03 NOTE — Therapy (Signed)
?OUTPATIENT OCCUPATIONAL THERAPY NEURO EVALUATION ONLY ? ?Patient Name: BRINTON BRANDEL ?MRN: [id number] ?DOB:08-16-1960, 61 y.o., male ?Today's Date: 06/03/2021 ? ?PCP: None ?REFERRING PROVIDER: Bonnielee Haff, MD  ? ? OT End of Session - 06/03/21 4098   ? ? Visit Number 1   ? Authorization Type Med Pay   ? OT Start Time 0845   ? OT Stop Time 0925   ? OT Time Calculation (min) 40 min   ? Activity Tolerance Patient tolerated treatment well   ? Behavior During Therapy [facility] for tasks assessed/performed   ? ?  ?  ? ?  ? ? ?Past Medical History:  ?Diagnosis Date  ? Allergy   ? Anxiety   ? Diabetes mellitus   ? Hyperlipidemia   ? Hypertension   ? Hypothyroid   ? Testosterone deficiency   ? ?Past Surgical History:  ?Procedure Laterality Date  ? APPENDECTOMY  2006  ? COLON SURGERY  2006  ? large diverticulum resected  ? COLONOSCOPY  09/23/2010  ? diverticulosis, lymphoid rectal polyp  ? IR ANGIO INTRA EXTRACRAN SEL INTERNAL CAROTID UNI L MOD SED  05/31/2021  ? IR ANGIOGRAM FOLLOW UP STUDY  05/31/2021  ? IR INTRAVSC STENT CERV CAROTID W/EMB-PROT MOD SED INCL ANGIO  05/31/2021  ? IR US GUIDE VASC ACCESS RIGHT  05/31/2021  ? RADIOLOGY WITH ANESTHESIA N/A 05/31/2021  ? Procedure: IR WITH ANESTHESIA;  Surgeon: Pedro Earls, MD;  Location: [facility];  Service: Radiology;  Laterality: N/A;  ? ?Patient Active Problem List  ? Diagnosis Date Noted  ? Acute CVA (cerebrovascular accident) ([place]) 05/30/2021  ? Facial twitching 05/29/2021  ? Numbness and tingling in right hand 05/29/2021  ? Hyponatremia 05/29/2021  ? CKD (chronic kidney disease) stage 2, GFR 60-89 ml/min 05/29/2021  ? Cerebral embolism with cerebral infarction 05/29/2021  ? Essential tremor 04/02/2014  ? Medication management 05/18/2013  ? Vitamin D deficiency 05/18/2013  ? T2_NIDDM with Stage 2 CKD (GFR 81 ml/min) 05/17/2013  ? Hyperlipidemia   ? Hypertension   ? Hypothyroid   ? Anxiety 09/23/2010  ? Testosterone deficiency   ? ? ?ONSET DATE: 05/30/2021 referral  date ? ?REFERRING DIAG: I63.9 (ICD-10-CM) - Acute CVA (cerebrovascular accident) ([place])  ? ?THERAPY DIAG:  ?Other lack of coordination ? ?SUBJECTIVE:  ? ?SUBJECTIVE STATEMENT: ?Pt is a 61 year old male who was admitted to ED with Right-sided facial twitching along with right handed numbness and tingling.  MRI shows left side acute to subacute infarcts in the frontal, parietal and occipital lobes and white matter.  EEG normal.  PMHx:DM, HLD, HTN, anxiety. Pt reports got home Wednesday and "feeling pretty good". Pt reports RUE hand is pretty weak but other than that feels things are back to baseline.  ? ?Pt accompanied by: self ? ?PERTINENT HISTORY: PMHx:DM, HLD, HTN, anxiety   ? ?PRECAUTIONS: Other: stent placement d/t CVA ? ?WEIGHT BEARING RESTRICTIONS No ? ?PAIN:  ?Are you having pain? No ? ?FALLS: Has patient fallen in last 6 months? No ? ?LIVING ENVIRONMENT: ?Lives with: lives with their spouse and dog ?Lives in: House/apartment ?Stairs:  one level, tub shower combo ?Has following equipment at home: None ? 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 ?PATIENT GOALS "get back to writing" ? ?OBJECTIVE:  ? ? DIAGNOSTIC FINDINGS: ?05/29/2021 IMPRESSION: ?1. Acute and/or subacute infarcts in the left frontal, occipital, ?and parietal cortex and white matter. Contrast enhancement ?associated with some of the left  frontal cortical infarcts suggest ?that these are subacute. No enhancement is associated with the ?remainder of the infarcts, which are likely acute. ?2. Loss of the distal left vertebral artery flow void, with poor ?opacification on postcontrast imaging, concerning for severe stenosis. ? ?HAND DOMINANCE: Right ? ?ADLs: ?Overall ADLs: Mod I for all ADLs. ? ? ?IADLs: Currently mod I for all IADLs. ?Handwriting: 50% legible of PLOF  ? ?MOBILITY STATUS: Independent ? ?POSTURE COMMENTS:  ?No Significant postural limitations ? ? ?FUNCTIONAL OUTCOME  MEASURES: ?FOTO: not captured at evaluation by front desk ? ?UE ROM    ? ?Active ROM Right ?05/30/2021 Left ?05/30/2021  ?Shoulder flexion WFL  ?Shoulder abduction   ?Shoulder adduction   ?Shoulder extension   ?Shoulder internal rotation   ?Shoulder external rotation   ?Elbow flexion   ?Elbow extension   ?Wrist flexion   ?Wrist extension   ?Wrist ulnar deviation   ?Wrist radial deviation   ?Wrist pronation   ?Wrist supination   ?(Blank rows = not tested) ? ? ?UE MMT:    ? ?MMT Right ?05/30/2021 Left ?05/30/2021  ?Shoulder flexion WFL  ?Shoulder abduction   ?Shoulder adduction   ?Shoulder extension   ?Shoulder internal rotation   ?Shoulder external rotation   ?Middle trapezius   ?Lower trapezius   ?Elbow flexion   ?Elbow extension   ?Wrist flexion   ?Wrist extension   ?Wrist ulnar deviation   ?Wrist radial deviation   ?Wrist pronation   ?Wrist supination   ?(Blank rows = not tested) ? ?HAND FUNCTION: ?Grip strength: Right: 45.1 lbs; Left: 60.4 lbs ?Lateral pinch: Right: 20 lbs, Left: 25 lbs ?3 point pinch: Right: 20 lbs, Left: 20 lbs ?Tip pinch: Right 18 lbs, Left: 18 lbs ?and 100% composite flexion and extension ? ?COORDINATION: ?9 Hole Peg test: Right: 38.35 sec; Left: 27.96 sec ? ?SENSATION: ?WFL ? ?MUSCLE TONE: RUE: Within functional limits ? ?COGNITION: ?Overall cognitive status: Within functional limits for tasks assessed and No family/caregiver present to determine baseline cognitive functioning ? ?VISION: ?Subjective report: Pt denies any changes with CVA ?Baseline vision: Wears glasses for reading only ?Visual history:  none ? ?VISION ASSESSMENT: ?WFL ? ?PERCEPTION: WFL ? ?PRAXIS: WFL ? ? ?TODAY'S TREATMENT:  ?06/03/21 issued coordination HEP and red theraputty HEP. ? ? ?PATIENT EDUCATION: ?Education details: Education on role and purpose of OT  ?Person educated: Patient ?Education method: Explanation ?Education comprehension: verbalized understanding ? ? ?HOME EXERCISE PROGRAM: ?Coordination HEP, Theraputty (Red)   ? ? ? ?GOALS: No Goals. Evaluation only. ? ?ASSESSMENT: ? ?CLINICAL IMPRESSION: ?Patient is a 61 y.o. male who was seen today for occupational therapy evaluation for Acute CVA. Pt was admitted to ED with right sided facial twitching and right handed numbness and tingling on 05/29/21. Pt presents with deficits with RUE coordination and strength. Skilled occupational therapy is not recommended following evaluation.  ? ?COMORBIDITIES has no other co-morbidities that affects occupational performance. Patient will benefit from skilled OT to address above impairments and improve overall function. ? ?MODIFICATION OR ASSISTANCE TO COMPLETE EVALUATION: No modification of tasks or assist necessary to complete an evaluation. ? ?OT OCCUPATIONAL PROFILE AND HISTORY: Problem focused assessment: Including review of records relating to presenting problem. ? ?CLINICAL DECISION MAKING: LOW - limited treatment options, no task modification necessary ? ?EVALUATION COMPLEXITY: Low ? ? ? ?PLAN: ?OT FREQUENCY: one time visit ? ? ?Zachery Conch, OT ?06/03/2021, 9:44 AM ? ? ? ? ? ? ? ?  ? ?

## 2021-06-04 NOTE — Discharge Summary (Signed)
?Physician Discharge Summary ?  ?Patient: Hunter Fields MRN: [id number] DOB: 1960/08/03  ?Admit date:     05/29/2021  ?Discharge date: 06/01/2021  ?Discharge Physician: Hosie Poisson  ? ?PCP: Pcp, No  ? ?Recommendations at discharge:  ?Please follow up with PCP in one week.  ?Please follow up with neurology and NEURO IR as recommended.  ? ? ?Discharge Diagnoses: ?Principal Problem: ?  Cerebral embolism with cerebral infarction ?Active Problems: ?  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  Hyperlipidemia ?  Hypertension ?  Facial twitching ?  Numbness and tingling in right hand ?  Hyponatremia ?  CKD (chronic kidney disease) stage 2, GFR 60-89 ml/min ?  Acute CVA (cerebrovascular accident) ([place]) ? ? ? ?Hospital Course: ? ?61 y.o. male with a past medical history of diabetes mellitus, hypertension, history of stroke about 7 years ago status post left carotid endarterectomy who was in his usual state of health till about 11:30 on the morning of admission.  He had also noted some numbness in his right hand previously.  Initially there was concern for seizure activity.  Patient was loaded with Keppra.  Subsequently MRI showed acute infarcts.  Neurology on board. Neuro IR consulted and pt underwent Left ICA stenting with cerebral protection device.  ? ? ?Assessment and Plan: ? ? ?ACUTE CVA in the left hemisphere:  ?MRI brain shows acute infarcts in the left hemisphere.  ?Neurology on board.  ?LDL is 82, Hemoglobin A1c is  ?  ?EEG does not show any epileptiform activity.  ?MRA no branch occlusion in the left MCA circulation.  ?CTA head and neck prior left carotid endarterectomy, soft plaque results in a severe, near occlusive focal stenosis in the proximal left ICA. ?Pt currently on aspirin, plavix, crestor and keppra.  ?IR consulted and pt underwent Cerebral angiogram and left ICA stenting with cerebral protection device.  ?Therapy eval recommending Outpatient PT/OT.  ?  ?  ?  ?Seizure like activity:  ?Currently on keppra.   ?  ?  ?Left ICA stenosis.  ?S/p left CEA 7 years ago.  ?IR consulted and underwent ICA stenting.  ?  ?  ?Hypertension;  ?Continue with lisinopril.  ?  ?  ?Hyperlipidemia:  ?Continue with crestor.  ?  ?  ?DM type 2  uncontrolled with hyperglycemia.  ?Discharged the patient on Semglee and novolog SSI.  ?Discussed with the patient that he needs to follow up with PCP in one week. .  ?  ?  ? ? ? ?Consultants: neurology.  ?Procedures performed: EEG ?MRA head and neck.  ?CTA head and neck.  ?  ?Disposition: Home ?Diet recommendation:  carb modified and low sodium diet.  ?Discharge Diet Orders (From admission, onward)  ? ?  Start     Ordered  ? 06/01/21 0000  Diet - low sodium heart healthy       ? 06/01/21 1316  ? ?  ?  ? ?  ? ? ?DISCHARGE MEDICATION: ?Allergies as of 06/01/2021   ? ?   Reactions  ? Food Swelling  ? MUSHROOMS=SWELLING  ? ?  ? ?  ?Medication List  ?  ? ?STOP taking these medications   ? ?fenofibrate micronized 134 MG capsule ?Commonly known as: LOFIBRA ?  ?glyBURIDE 5 MG tablet ?Commonly known as: DIABETA ?  ?HYDROcodone-acetaminophen 5-325 MG tablet ?Commonly known as: Norco ?  ?levothyroxine 75 MCG tablet ?Commonly known as: Synthroid ?  ?losartan-hydrochlorothiazide 100-25 MG tablet ?Commonly known as: Hyzaar ?  ?  testosterone cypionate 200 MG/ML injection ?Commonly known as: DEPOTESTOSTERONE CYPIONATE ?  ? ?  ? ?TAKE these medications   ? ?aspirin 81 MG EC tablet ?Take 81 mg by mouth daily. ?  ?blood glucose meter kit and supplies Kit ?Dispense based on patient and insurance preference. Use up to four times daily as directed. ?  ?clopidogrel 75 MG tablet ?Commonly known as: PLAVIX ?Take 1 tablet (75 mg total) by mouth daily. ?  ?diazepam 10 MG tablet ?Commonly known as: VALIUM ?Take 1/2 to 1 tablet by mouth 4 times daily for tremor. ?  ?insulin aspart 100 UNIT/ML FlexPen ?Commonly known as: NOVOLOG ?CBG 70 - 120: 0 units ? CBG 121 - 150: 2 units ? CBG 151 - 200: 3 units ? CBG 201 - 250: 5 units ? CBG 251  - 300: 8 units ? CBG 301 - 350: 11 units  ?CBG 351 - 400: 15 units ?  ?insulin aspart 100 UNIT/ML injection ?Commonly known as: novoLOG ?Inject 3 Units into the skin 3 (three) times daily with meals. ?  ?insulin glargine-yfgn 100 UNIT/ML injection ?Commonly known as: SEMGLEE ?Inject 0.08 mLs (8 Units total) into the skin daily. ?  ?levETIRAcetam 500 MG tablet ?Commonly known as: KEPPRA ?Take 1 tablet (500 mg total) by mouth 2 (two) times daily. ?  ?lisinopril 40 MG tablet ?Commonly known as: ZESTRIL ?Take 1 tablet (40 mg total) by mouth daily. ?  ?metFORMIN 1000 MG tablet ?Commonly known as: GLUCOPHAGE ?Take 1,000 mg by mouth 2 (two) times daily with a meal. ?What changed: Another medication with the same name was removed. Continue taking this medication, and follow the directions you see here. ?  ?multivitamin tablet ?Take 1 tablet by mouth daily. ?  ?rosuvastatin 20 MG tablet ?Commonly known as: CRESTOR ?Take 1 tablet (20 mg total) by mouth daily. ?  ?Unifine Pentips 31G X 8 MM Misc ?Generic drug: Insulin Pen Needle ?Use to inject insulin 3 times a day before meals ?  ? ?  ? ? Follow-up Information   ? ? [facility] Follow up on 06/06/2021.   ?Specialty: Internal Medicine ?Why: Your appointment is at 2 pm. Please arrive early and bring a picture ID and your current medications. ?Contact information: ?[address] ?[place] ?[phone] ? ?  ?  ? ? [facility] Follow up.   ?Why: Use this pharmacy to assist with the cost of medications. ?Contact information: ?[address] ?[place], [place] [address] ? ?[phone] ? ?  ?  ? ? [facility]. Schedule an appointment as soon as possible for a visit in 1 week(s).   ?Specialty: Rehabilitation ?Contact information: ?[place] ?[id number] ?[place] ?[phone] ? ?  ?  ? ? [facility]. Schedule an  appointment as soon as possible for a visit in 1 month(s).   ?Specialty: Neurology ?Why: stroke clinic ?Contact information: ?[place] ?[phone] ? ?  ?  ? ?  ?  ? ?  ? ?Discharge Exam: ?Hunter Danker Weights  ? 05/29/21 2300  ?Weight: 71.3 kg  ? ?General exam: Appears calm and comfortable  ?Respiratory system: Clear to auscultation. Respiratory effort normal. ?Cardiovascular system: S1 & S2 heard, RRR. No JVD,  No pedal edema. ?Gastrointestinal system: Abdomen is nondistended, soft and nontender.  Normal bowel sounds heard. ?Central nervous system: Alert and oriented. No focal neurological deficits. ?Extremities:  Symmetric 5 x 5 power. ?Skin: No rashes, lesions or ulcers ?Psychiatry: Judgement and insight appear normal. Mood & affect appropriate.  ? ? ?Condition at discharge: fair ? ?The results of significant diagnostics from this hospitalization (including imaging, microbiology, ancillary and laboratory) are listed below for reference.  ? ?Imaging Studies: ?CT ANGIO HEAD NECK W WO CM ? ?Result Date: 05/30/2021 ?CLINICAL DATA:  Provided history: Stroke/TIA, determine embolic source. EXAM: CT ANGIOGRAPHY HEAD AND NECK TECHNIQUE: Multidetector CT imaging of the head and neck was performed using the standard protocol during bolus administration of intravenous contrast. Multiplanar CT image reconstructions and MIPs were obtained to evaluate the vascular anatomy. Carotid stenosis measurements (when applicable) are obtained utilizing NASCET criteria, using the distal internal carotid diameter as the denominator. RADIATION DOSE REDUCTION: This exam was performed according to the departmental dose-optimization program which includes automated exposure control, adjustment of the mA and/or kV according to patient size and/or use of iterative reconstruction technique. CONTRAST:  42m OMNIPAQUE IOHEXOL 350 MG/ML SOLN COMPARISON:  MRI brain 05/29/2021. MRA head 05/30/2021. FINDINGS:  CT HEAD FINDINGS Brain: No age-advanced or lobar predominant parenchymal atrophy. Multiple acute/early subacute cortical and subcortical infarcts within the left frontal, parietal, occipital and pos

## 2021-06-06 ENCOUNTER — Ambulatory Visit: Payer: Self-pay | Admitting: Nurse Practitioner

## 2021-06-23 ENCOUNTER — Other Ambulatory Visit: Payer: Self-pay

## 2021-07-08 ENCOUNTER — Other Ambulatory Visit (HOSPITAL_COMMUNITY): Payer: Self-pay

## 2021-07-09 ENCOUNTER — Other Ambulatory Visit (HOSPITAL_COMMUNITY): Payer: Self-pay

## 2022-06-27 ENCOUNTER — Ambulatory Visit (INDEPENDENT_AMBULATORY_CARE_PROVIDER_SITE_OTHER): Payer: Self-pay | Admitting: Vascular Surgery

## 2022-06-27 ENCOUNTER — Encounter: Payer: Self-pay | Admitting: Vascular Surgery

## 2022-06-27 VITALS — BP 124/74 | HR 110 | Temp 98.3°F | Resp 20 | Ht 68.0 in | Wt 173.5 lb

## 2022-06-27 DIAGNOSIS — I70212 Atherosclerosis of native arteries of extremities with intermittent claudication, left leg: Secondary | ICD-10-CM

## 2022-06-27 NOTE — Progress Notes (Signed)
[facility] [place]  ASSESSMENT / PLAN: Hunter Fields is a 62 y.o. 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  Patient counseled patients with asymptomatic peripheral arterial disease or claudication have a 1-2% risk of developing chronic limb threatening ischemia, but a 15-30% risk of mortality in the next 5 years. Intervention should only be considered for medically optimized patients with disabling symptoms.   Recommend the following which can slow the progression of atherosclerosis and reduce the risk of major adverse cardiac / limb events:  Complete cessation from all tobacco products. Blood glucose control with goal A1c < 7%. Blood pressure control with goal blood pressure < 140/90 mmHg. Lipid reduction therapy with goal LDL-C <100 mg/dL (<16 if symptomatic from PAD).  Aspirin 81mg  PO QD.  Atorvastatin 40-80mg  PO QD (or other "high intensity" statin therapy). Daily walking to and past the point of discomfort. Patient counseled to keep a log of exercise distance. Adequate hydration (at least 2 liters / day) if patient's heart and kidney function is adequate.  The patient is hopeful to avoid surgical intervention.  He would require a femoral-tibial bypass based on Dr. Lajuana Ripple angiogram report.  I encouraged him to try exercise therapy before proceeding with the bypass.  I will see him back in 3 months with an ABI.  CHIEF COMPLAINT: Left leg pain with walking  HISTORY OF PRESENT ILLNESS: Hunter Fields is a 62 y.o. male who presents to clinic for evaluation of left leg claudication.  He has seen Dr. Joseph Pierini for the same.  He desires care locally in [place] as he lives here.  During the course of his workup with Dr. Luan Pulling, he underwent an angiogram which showed SFA and popliteal artery occlusion with reconstitution of the posterior tibial artery.  Ultimately a femoral-will bypass was  recommended.  On my evaluation today, the patient reports cramping leg discomfort with walking.  This does not limit his ability to perform work or his activities of daily living.  He is only mildly bothered by the symptoms.  We reviewed the natural history of intermittent claudication the benign nature of this diagnosis.  We reviewed the rationale for an exercise regimen prior to intervention.  I reviewed his angiogram findings, and explained that an endovascular solution would not likely be successful given the length of the occlusion across the knee joint.  Ultimately we agreed to trial a course of exercise therapy before proceeding with any kind of intervention.  Past Medical History:  Diagnosis Date   Allergy    Anxiety    Diabetes mellitus    Hyperlipidemia    Hypertension    Hypothyroid    Testosterone deficiency     Past Surgical History:  Procedure Laterality Date   APPENDECTOMY  2006   COLON SURGERY  2006   large diverticulum resected   COLONOSCOPY  09/23/2010   diverticulosis, lymphoid rectal polyp   IR ANGIO INTRA EXTRACRAN SEL INTERNAL CAROTID UNI L MOD SED  05/31/2021   IR ANGIOGRAM FOLLOW UP STUDY  05/31/2021   IR INTRAVSC STENT CERV CAROTID W/EMB-PROT MOD SED INCL ANGIO  05/31/2021   IR US GUIDE VASC ACCESS RIGHT  05/31/2021   RADIOLOGY WITH ANESTHESIA N/A 05/31/2021   Procedure: IR WITH ANESTHESIA;  Surgeon: Baldemar Lenis, MD;  Location: [facility] OR;  Service: Radiology;  Laterality: N/A;    History reviewed. No pertinent family history.  Social History   Socioeconomic History  Marital status: Legally Separated    Spouse name: Not on file   Number of children: Not on file   Years of education: Not on file   Highest education level: Not on file  Occupational History   Not on file  Tobacco Use   Smoking status: Former    Packs/day: .5    Types: Cigarettes    Quit date: 01/31/1995    Years since quitting: 27.4   Smokeless tobacco: Never  Substance and  Sexual Activity   Alcohol use: Yes    Alcohol/week: 2.0 standard drinks of alcohol    Types: 2 Standard drinks or equivalent per week    Comment: OCC WINE OR BEER,MAYBE MONTHLY   Drug use: No   Sexual activity: Not on file  Other Topics Concern   Not on file  Social History Narrative   Not on file   Social Determinants of Health   Financial Resource Strain: Not on file  Food Insecurity: Not on file  Transportation Needs: Not on file  Physical Activity: Not on file  Stress: Not on file  Social Connections: Not on file  Intimate Partner Violence: Not on file    Allergies  Allergen Reactions   Food Swelling    MUSHROOMS=SWELLING    Current Outpatient Medications  Medication Sig Dispense Refill   Accu-Chek Softclix Lancets lancets Use to check blood sugar up to 4 times a day 100 each 0   Ascorbic Acid (VITAMIN C) 1000 MG tablet Take 1,000 mg by mouth daily.     aspirin 81 MG EC tablet Take 81 mg by mouth daily.     B Complex-C (B-COMPLEX WITH VITAMIN C) tablet Take 1 tablet by mouth daily.     blood glucose meter kit and supplies KIT Dispense based on patient and insurance preference. Use up to four times daily as directed. 1 each 0   Blood Glucose Monitoring Suppl (ACCU-CHEK GUIDE) w/Device KIT Use to check blood sugar up to 4 times a day 1 kit 0   buPROPion (WELLBUTRIN XL) 300 MG 24 hr tablet Take 300 mg by mouth daily.     cholecalciferol (VITAMIN D3) 25 MCG (1000 UNIT) tablet Take 10,000 Units by mouth daily.     cilostazol (PLETAL) 100 MG tablet Take 100 mg by mouth 2 (two) times daily.     clopidogrel (PLAVIX) 75 MG tablet Take 1 tablet (75 mg total) by mouth daily. 30 tablet 3   diazepam (VALIUM) 10 MG tablet Take 1/2 to 1 tablet by mouth 4 times daily for tremor. 50 tablet 0   glipiZIDE (GLUCOTROL) 10 MG tablet Take 1 tablet by mouth 2 (two) times daily.     glucose blood (ACCU-CHEK GUIDE) test strip Use to check blood sugar up to 4 times a day 100 each 0   insulin  aspart (NOVOLOG) 100 UNIT/ML FlexPen CBG 70 - 120: 0 units  CBG 121 - 150: 2 units  CBG 151 - 200: 3 units  CBG 201 - 250: 5 units  CBG 251 - 300: 8 units  CBG 301 - 350: 11 units  CBG 351 - 400: 15 units 15 mL 11   insulin aspart (NOVOLOG) 100 UNIT/ML FlexPen Inject as directed: BS 70-120 zero units:BS121-150 use 2 Units:BS151-200 use 3 Units;BS201-250 use5 U;BS251-300 use 8U;BS301-350 use11 U;BS351-400 use 15U 15 mL 11   insulin aspart (NOVOLOG) 100 UNIT/ML injection Inject 3 Units into the skin 3 (three) times daily with meals. 10 mL 11   insulin glargine-yfgn (  SEMGLEE) 100 UNIT/ML injection Inject 0.08 mLs (8 Units total) into the skin daily. 10 mL 11   Insulin Pen Needle 31G X 8 MM MISC Use to inject insulin 3 times a day before meals 100 each 2   levETIRAcetam (KEPPRA) 500 MG tablet Take 1 tablet (500 mg total) by mouth 2 (two) times daily. 60 tablet 3   lisinopril (ZESTRIL) 40 MG tablet Take 1 tablet (40 mg total) by mouth daily. 90 tablet 2   metFORMIN (GLUCOPHAGE) 1000 MG tablet Take 1,000 mg by mouth 2 (two) times daily with a meal.     Multiple Vitamin (MULTIVITAMIN) tablet Take 1 tablet by mouth daily.       pioglitazone (ACTOS) 15 MG tablet Take 1 tablet by mouth daily.     rosuvastatin (CRESTOR) 20 MG tablet Take 1 tablet (20 mg total) by mouth daily. 30 tablet 1   No current facility-administered medications for this visit.    PHYSICAL EXAM Vitals:   06/27/22 1118  BP: 124/74  Pulse: (!) 110  Resp: 20  Temp: 98.3 F (36.8 C)  SpO2: 99%  Weight: 173 lb 8 oz (78.7 kg)  Height: 5\' 8"  (1.727 m)   Well-appearing man in no acute distress Regular rate and rhythm Unlabored breathing No palpable pedal pulses on the left or right  PERTINENT LABORATORY AND RADIOLOGIC DATA  Most recent CBC    Latest Ref Rng & Units 06/01/2021    4:58 AM 05/31/2021    2:56 AM 05/29/2021   11:58 PM  CBC  WBC 4.0 - 10.5 K/uL 7.5  6.8  6.2   Hemoglobin 13.0 - 17.0 g/dL 16.1  09.6  04.5    Hematocrit 39.0 - 52.0 % 38.2  40.4  42.7   Platelets 150 - 400 K/uL 283  290  302      Most recent CMP    Latest Ref Rng & Units 06/01/2021    2:56 PM 06/01/2021    4:58 AM 05/31/2021   10:25 PM  CMP  Glucose 70 - 99 mg/dL 409  811  914   BUN 8 - 23 mg/dL 15  15    Creatinine 7.82 - 1.24 mg/dL 9.56  2.13    Sodium 086 - 145 mmol/L 135  133    Potassium 3.5 - 5.1 mmol/L 4.2  3.8    Chloride 98 - 111 mmol/L 103  102    CO2 22 - 32 mmol/L 25  21    Calcium 8.9 - 10.3 mg/dL 9.1  8.6    Total Protein 6.5 - 8.1 g/dL  5.3    Total Bilirubin 0.3 - 1.2 mg/dL  0.4    Alkaline Phos 38 - 126 U/L  67    AST 15 - 41 U/L  26    ALT 0 - 44 U/L  25      Renal function CrCl cannot be calculated (Patient's most recent lab result is older than the maximum 21 days allowed.).  Hgb A1c MFr Bld (%)  Date Value  06/01/2021 >15.5 (H)    LDL Cholesterol  Date Value Ref Range Status  05/29/2021 UNABLE TO CALCULATE IF TRIGLYCERIDE OVER 400 mg/dL 0 - 99 mg/dL Final    Comment:           Total Cholesterol/HDL:CHD Risk Coronary Heart Disease Risk Table                     Men   Women  1/2 Average Risk  3.4   3.3  Average Risk       5.0   4.4  2 X Average Risk   9.6   7.1  3 X Average Risk  23.4   11.0        Use the calculated Patient Ratio above and the CHD Risk Table to determine the patient's CHD Risk.        ATP III CLASSIFICATION (LDL):  <100     mg/dL   Optimal  161-096  mg/dL   Near or Above                    Optimal  130-159  mg/dL   Borderline  045-409  mg/dL   High  >811     mg/dL   Very High Performed at [facility], [address] [place], [place] [address]    Direct LDL  Date Value Ref Range Status  05/29/2021 82.0 0 - 99 mg/dL Final    Comment:    Performed at [facility], [address] [place], [place] [address]    Outside angiogram report personally reviewed.  Left SFA and popliteal artery occlusion with reconstitution of the posterior tibial artery  and peroneal artery   Rande Brunt. Lenell Antu, MD FACS [facility] Phone Number: [phone] 06/27/2022 3:46 PM   Total time spent on preparing this encounter including chart review, data review, collecting history, examining the patient, coordinating care for this new patient, 60 minutes.  Portions of this report may have been transcribed using voice recognition software.  Every effort has been made to ensure accuracy; however, inadvertent computerized transcription errors may still be present.

## 2022-07-03 ENCOUNTER — Other Ambulatory Visit: Payer: Self-pay

## 2022-07-03 DIAGNOSIS — I70212 Atherosclerosis of native arteries of extremities with intermittent claudication, left leg: Secondary | ICD-10-CM

## 2022-09-25 NOTE — Progress Notes (Deleted)
[facility] [place]  ASSESSMENT / PLAN: Hunter Fields is a 62 y.o. 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  Patient counseled patients with asymptomatic peripheral arterial disease or claudication have a 1-2% risk of developing chronic limb threatening ischemia, but a 15-30% risk of mortality in the next 5 years. Intervention should only be considered for medically optimized patients with disabling symptoms.   Recommend the following which can slow the progression of atherosclerosis and reduce the risk of major adverse cardiac / limb events:  Complete cessation from all tobacco products. Blood glucose control with goal A1c < 7%. Blood pressure control with goal blood pressure < 140/90 mmHg. Lipid reduction therapy with goal LDL-C <100 mg/dL (<42 if symptomatic from PAD).  Aspirin 81mg  PO QD.  Atorvastatin 40-80mg  PO QD (or other "high intensity" statin therapy). Daily walking to and past the point of discomfort. Patient counseled to keep a log of exercise distance. Adequate hydration (at least 2 liters / day) if patient's heart and kidney function is adequate.  The patient is hopeful to avoid surgical intervention.  He would require a femoral-tibial bypass based on Dr. Lajuana Ripple angiogram report.  I encouraged him to try exercise therapy before proceeding with the bypass.  I will see him back in 3 months with an ABI.  CHIEF COMPLAINT: Left leg pain with walking  HISTORY OF PRESENT ILLNESS: Hunter Fields is a 62 y.o. male who presents to clinic for evaluation of left leg claudication.  He has seen Dr. Joseph Pierini for the same.  He desires care locally in [place] as he lives here.  During the course of his workup with Dr. Luan Pulling, he underwent an angiogram which showed SFA and popliteal artery occlusion with reconstitution of the posterior tibial artery.  Ultimately a femoral-will bypass was  recommended.  On my evaluation today, the patient reports cramping leg discomfort with walking.  This does not limit his ability to perform work or his activities of daily living.  He is only mildly bothered by the symptoms.  We reviewed the natural history of intermittent claudication the benign nature of this diagnosis.  We reviewed the rationale for an exercise regimen prior to intervention.  I reviewed his angiogram findings, and explained that an endovascular solution would not likely be successful given the length of the occlusion across the knee joint.  Ultimately we agreed to trial a course of exercise therapy before proceeding with any kind of intervention.  Past Medical History:  Diagnosis Date   Allergy    Anxiety    Diabetes mellitus    Hyperlipidemia    Hypertension    Hypothyroid    Testosterone deficiency     Past Surgical History:  Procedure Laterality Date   APPENDECTOMY  2006   COLON SURGERY  2006   large diverticulum resected   COLONOSCOPY  09/23/2010   diverticulosis, lymphoid rectal polyp   IR ANGIO INTRA EXTRACRAN SEL INTERNAL CAROTID UNI L MOD SED  05/31/2021   IR ANGIOGRAM FOLLOW UP STUDY  05/31/2021   IR INTRAVSC STENT CERV CAROTID W/EMB-PROT MOD SED INCL ANGIO  05/31/2021   IR US GUIDE VASC ACCESS RIGHT  05/31/2021   RADIOLOGY WITH ANESTHESIA N/A 05/31/2021   Procedure: IR WITH ANESTHESIA;  Surgeon: Baldemar Lenis, MD;  Location: [facility] OR;  Service: Radiology;  Laterality: N/A;    No family history on file.  Social History   Socioeconomic History   Marital  status: Legally Separated    Spouse name: Not on file   Number of children: Not on file   Years of education: Not on file   Highest education level: Not on file  Occupational History   Not on file  Tobacco Use   Smoking status: Former    Current packs/day: 0.00    Types: Cigarettes    Quit date: 01/31/1995    Years since quitting: 27.6   Smokeless tobacco: Never  Substance and Sexual  Activity   Alcohol use: Yes    Alcohol/week: 2.0 standard drinks of alcohol    Types: 2 Standard drinks or equivalent per week    Comment: OCC WINE OR BEER,MAYBE MONTHLY   Drug use: No   Sexual activity: Not on file  Other Topics Concern   Not on file  Social History Narrative   Not on file   Social Determinants of Health   Financial Resource Strain: Medium Risk (05/24/2022)   Received from [facility]   Overall Financial Resource Strain (CARDIA)    Difficulty of Paying Living Expenses: Somewhat hard  Food Insecurity: Food Insecurity Present (05/24/2022)   Received [facility]   Hunger Vital Sign    Worried About Running Out of Food in the Last Year: Sometimes true    Ran Out of Food in the Last Year: Sometimes true  Transportation Needs: No Transportation Needs (05/24/2022)   Received from [facility] - Transportation    Lack of Transportation (Medical): No    Lack of Transportation (Non-Medical): No  Physical Activity: Unknown (05/24/2022)   Received from [facility]   Exercise Vital Sign    Days of Exercise per Week: 0 days    Minutes of Exercise per Session: Not on file  Stress: Stress Concern Present (05/24/2022)   Received from [facility] of Occupational [facility] - Occupational Stress Questionnaire    Feeling of Stress : To some extent  Social Connections: Moderately Integrated (05/24/2022)   Received from [facility]   Social Network    How would you rate your social network (family, work, friends)?: Adequate participation with social networks  Intimate Partner Violence: Not At Risk (05/24/2022)   Received from [facility]   HITS    Over the last 12 months how often did your partner physically hurt you?: 1    Over the last 12 months how often did your partner insult you or talk down to you?: 1    Over the last 12 months how often did your partner threaten you with physical harm?: 1    Over the last 12 months how often did  your partner scream or curse at you?: 1    Allergies  Allergen Reactions   Food Swelling    MUSHROOMS=SWELLING    Current Outpatient Medications  Medication Sig Dispense Refill   Accu-Chek Softclix Lancets lancets Use to check blood sugar up to 4 times a day 100 each 0   Ascorbic Acid (VITAMIN C) 1000 MG tablet Take 1,000 mg by mouth daily.     aspirin 81 MG EC tablet Take 81 mg by mouth daily.     B Complex-C (B-COMPLEX WITH VITAMIN C) tablet Take 1 tablet by mouth daily.     blood glucose meter kit and supplies KIT Dispense based on patient and insurance preference. Use up to four times daily as directed. 1 each 0   Blood Glucose Monitoring Suppl (ACCU-CHEK GUIDE) w/Device KIT Use to check  blood sugar up to 4 times a day 1 kit 0   buPROPion (WELLBUTRIN XL) 300 MG 24 hr tablet Take 300 mg by mouth daily.     cholecalciferol (VITAMIN D3) 25 MCG (1000 UNIT) tablet Take 10,000 Units by mouth daily.     clopidogrel (PLAVIX) 75 MG tablet Take 1 tablet (75 mg total) by mouth daily. 30 tablet 3   diazepam (VALIUM) 10 MG tablet Take 1/2 to 1 tablet by mouth 4 times daily for tremor. 50 tablet 0   glipiZIDE (GLUCOTROL) 10 MG tablet Take 1 tablet by mouth 2 (two) times daily.     glucose blood (ACCU-CHEK GUIDE) test strip Use to check blood sugar up to 4 times a day 100 each 0   insulin aspart (NOVOLOG) 100 UNIT/ML FlexPen CBG 70 - 120: 0 units  CBG 121 - 150: 2 units  CBG 151 - 200: 3 units  CBG 201 - 250: 5 units  CBG 251 - 300: 8 units  CBG 301 - 350: 11 units  CBG 351 - 400: 15 units 15 mL 11   insulin aspart (NOVOLOG) 100 UNIT/ML FlexPen Inject as directed: BS 70-120 zero units:BS121-150 use 2 Units:BS151-200 use 3 Units;BS201-250 use5 U;BS251-300 use 8U;BS301-350 use11 U;BS351-400 use 15U 15 mL 11   insulin aspart (NOVOLOG) 100 UNIT/ML injection Inject 3 Units into the skin 3 (three) times daily with meals. 10 mL 11   insulin glargine-yfgn (SEMGLEE) 100 UNIT/ML injection Inject 0.08 mLs  (8 Units total) into the skin daily. 10 mL 11   Insulin Pen Needle 31G X 8 MM MISC Use to inject insulin 3 times a day before meals 100 each 2   levETIRAcetam (KEPPRA) 500 MG tablet Take 1 tablet (500 mg total) by mouth 2 (two) times daily. 60 tablet 3   lisinopril (ZESTRIL) 40 MG tablet Take 1 tablet (40 mg total) by mouth daily. 90 tablet 2   metFORMIN (GLUCOPHAGE) 1000 MG tablet Take 1,000 mg by mouth 2 (two) times daily with a meal.     Multiple Vitamin (MULTIVITAMIN) tablet Take 1 tablet by mouth daily.       pioglitazone (ACTOS) 15 MG tablet Take 1 tablet by mouth daily.     rosuvastatin (CRESTOR) 20 MG tablet Take 1 tablet (20 mg total) by mouth daily. 30 tablet 1   No current facility-administered medications for this visit.    PHYSICAL EXAM There were no vitals filed for this visit.  Well-appearing man in no acute distress Regular rate and rhythm Unlabored breathing No palpable pedal pulses on the left or right  PERTINENT LABORATORY AND RADIOLOGIC DATA  Most recent CBC    Latest Ref Rng & Units 06/01/2021    4:58 AM 05/31/2021    2:56 AM 05/29/2021   11:58 PM  CBC  WBC 4.0 - 10.5 K/uL 7.5  6.8  6.2   Hemoglobin 13.0 - 17.0 g/dL 13.2  44.0  10.2   Hematocrit 39.0 - 52.0 % 38.2  40.4  42.7   Platelets 150 - 400 K/uL 283  290  302      Most recent CMP    Latest Ref Rng & Units 06/01/2021    2:56 PM 06/01/2021    4:58 AM 05/31/2021   10:25 PM  CMP  Glucose 70 - 99 mg/dL 725  366  440   BUN 8 - 23 mg/dL 15  15    Creatinine 3.47 - 1.24 mg/dL 4.25  9.56    Sodium 387 -  145 mmol/L 135  133    Potassium 3.5 - 5.1 mmol/L 4.2  3.8    Chloride 98 - 111 mmol/L 103  102    CO2 22 - 32 mmol/L 25  21    Calcium 8.9 - 10.3 mg/dL 9.1  8.6    Total Protein 6.5 - 8.1 g/dL  5.3    Total Bilirubin 0.3 - 1.2 mg/dL  0.4    Alkaline Phos 38 - 126 U/L  67    AST 15 - 41 U/L  26    ALT 0 - 44 U/L  25      Renal function CrCl cannot be calculated (Patient's most recent lab result is  older than the maximum 21 days allowed.).  Hgb A1c MFr Bld (%)  Date Value  06/01/2021 >15.5 (H)    LDL Cholesterol  Date Value Ref Range Status  05/29/2021 UNABLE TO CALCULATE IF TRIGLYCERIDE OVER 400 mg/dL 0 - 99 mg/dL Final    Comment:           Total Cholesterol/HDL:CHD Risk Coronary Heart Disease Risk Table                     Men   Women  1/2 Average Risk   3.4   3.3  Average Risk       5.0   4.4  2 X Average Risk   9.6   7.1  3 X Average Risk  23.4   11.0        Use the calculated Patient Ratio above and the CHD Risk Table to determine the patient's CHD Risk.        ATP III CLASSIFICATION (LDL):  <100     mg/dL   Optimal  841-324  mg/dL   Near or Above                    Optimal  130-159  mg/dL   Borderline  401-027  mg/dL   High  >253     mg/dL   Very High Performed at [facility], [address], [place], [place] [address]    Direct LDL  Date Value Ref Range Status  05/29/2021 82.0 0 - 99 mg/dL Final    Comment:    Performed at [facility], [address], [place], [place] [address]    Outside angiogram report personally reviewed.  Left SFA and popliteal artery occlusion with reconstitution of the posterior tibial artery and peroneal artery   Rande Brunt. Lenell Antu, MD FACS [facility] Phone Number: [phone] 09/25/2022 11:10 AM   Total time spent on preparing this encounter including chart review, data review, collecting history, examining the patient, coordinating care for this new patient, 60 minutes.  Portions of this report may have been transcribed using voice recognition software.  Every effort has been made to ensure accuracy; however, inadvertent computerized transcription errors may still be present.

## 2022-09-26 ENCOUNTER — Ambulatory Visit (HOSPITAL_COMMUNITY): Payer: BLUE CROSS/BLUE SHIELD | Attending: Vascular Surgery

## 2022-09-26 ENCOUNTER — Ambulatory Visit: Payer: Self-pay | Admitting: Vascular Surgery
